# Patient Record
Sex: Female | Born: 1965 | Race: White | Hispanic: No | Marital: Married | Smoking: Never smoker
Health system: Southern US, Community
[De-identification: ages and names within clinical notes are randomized; demographics above are authoritative.]

## PROBLEM LIST (undated history)

## (undated) DIAGNOSIS — R519 Headache, unspecified: Secondary | ICD-10-CM

## (undated) DIAGNOSIS — C801 Malignant (primary) neoplasm, unspecified: Secondary | ICD-10-CM

## (undated) DIAGNOSIS — K219 Gastro-esophageal reflux disease without esophagitis: Secondary | ICD-10-CM

## (undated) DIAGNOSIS — J189 Pneumonia, unspecified organism: Secondary | ICD-10-CM

## (undated) DIAGNOSIS — R609 Edema, unspecified: Secondary | ICD-10-CM

## (undated) DIAGNOSIS — G4730 Sleep apnea, unspecified: Secondary | ICD-10-CM

## (undated) DIAGNOSIS — T8859XA Other complications of anesthesia, initial encounter: Secondary | ICD-10-CM

## (undated) DIAGNOSIS — T7840XA Allergy, unspecified, initial encounter: Secondary | ICD-10-CM

## (undated) DIAGNOSIS — Z9889 Other specified postprocedural states: Secondary | ICD-10-CM

## (undated) DIAGNOSIS — M549 Dorsalgia, unspecified: Secondary | ICD-10-CM

## (undated) DIAGNOSIS — Z8719 Personal history of other diseases of the digestive system: Secondary | ICD-10-CM

## (undated) DIAGNOSIS — J40 Bronchitis, not specified as acute or chronic: Secondary | ICD-10-CM

## (undated) DIAGNOSIS — J329 Chronic sinusitis, unspecified: Secondary | ICD-10-CM

## (undated) DIAGNOSIS — J45909 Unspecified asthma, uncomplicated: Secondary | ICD-10-CM

## (undated) DIAGNOSIS — T4145XA Adverse effect of unspecified anesthetic, initial encounter: Secondary | ICD-10-CM

## (undated) DIAGNOSIS — R51 Headache: Secondary | ICD-10-CM

## (undated) DIAGNOSIS — R112 Nausea with vomiting, unspecified: Secondary | ICD-10-CM

## (undated) HISTORY — PX: TUBAL LIGATION: SHX77

## (undated) HISTORY — DX: Gastro-esophageal reflux disease without esophagitis: K21.9

## (undated) HISTORY — PX: KNEE SURGERY: SHX244

## (undated) HISTORY — PX: CHOLECYSTECTOMY: SHX55

## (undated) HISTORY — PX: ABDOMINAL HYSTERECTOMY: SHX81

## (undated) HISTORY — DX: Unspecified asthma, uncomplicated: J45.909

## (undated) HISTORY — DX: Sleep apnea, unspecified: G47.30

---

## 2008-10-14 ENCOUNTER — Emergency Department (HOSPITAL_COMMUNITY): Admission: EM | Admit: 2008-10-14 | Discharge: 2008-10-14 | Payer: Self-pay | Admitting: Emergency Medicine

## 2009-01-28 DIAGNOSIS — G43109 Migraine with aura, not intractable, without status migrainosus: Secondary | ICD-10-CM | POA: Insufficient documentation

## 2011-07-14 ENCOUNTER — Ambulatory Visit: Payer: Self-pay | Attending: Orthopedic Surgery | Admitting: Physical Therapy

## 2011-07-14 DIAGNOSIS — IMO0001 Reserved for inherently not codable concepts without codable children: Secondary | ICD-10-CM | POA: Insufficient documentation

## 2011-07-14 DIAGNOSIS — M25569 Pain in unspecified knee: Secondary | ICD-10-CM | POA: Insufficient documentation

## 2011-07-14 DIAGNOSIS — M25669 Stiffness of unspecified knee, not elsewhere classified: Secondary | ICD-10-CM | POA: Insufficient documentation

## 2011-07-15 ENCOUNTER — Ambulatory Visit: Payer: Self-pay | Admitting: Physical Therapy

## 2011-07-22 ENCOUNTER — Ambulatory Visit: Payer: Self-pay | Admitting: Physical Therapy

## 2011-07-29 ENCOUNTER — Ambulatory Visit: Payer: Self-pay | Attending: Orthopedic Surgery | Admitting: Physical Therapy

## 2011-07-29 DIAGNOSIS — IMO0001 Reserved for inherently not codable concepts without codable children: Secondary | ICD-10-CM | POA: Insufficient documentation

## 2011-07-29 DIAGNOSIS — M25669 Stiffness of unspecified knee, not elsewhere classified: Secondary | ICD-10-CM | POA: Insufficient documentation

## 2011-07-29 DIAGNOSIS — M25569 Pain in unspecified knee: Secondary | ICD-10-CM | POA: Insufficient documentation

## 2011-08-02 ENCOUNTER — Ambulatory Visit: Payer: Self-pay | Admitting: Physical Therapy

## 2011-08-04 ENCOUNTER — Ambulatory Visit: Payer: Self-pay | Admitting: Physical Therapy

## 2011-08-09 ENCOUNTER — Ambulatory Visit: Payer: Self-pay | Admitting: Physical Therapy

## 2011-08-11 ENCOUNTER — Ambulatory Visit: Payer: Self-pay | Admitting: Physical Therapy

## 2011-08-12 ENCOUNTER — Ambulatory Visit: Payer: Self-pay | Admitting: Physical Therapy

## 2011-08-23 ENCOUNTER — Ambulatory Visit: Payer: Self-pay | Admitting: Physical Therapy

## 2011-08-25 ENCOUNTER — Ambulatory Visit: Payer: Self-pay | Admitting: Physical Therapy

## 2011-08-30 ENCOUNTER — Encounter: Payer: Self-pay | Admitting: Physical Therapy

## 2012-08-31 ENCOUNTER — Ambulatory Visit
Admission: RE | Admit: 2012-08-31 | Discharge: 2012-08-31 | Disposition: A | Discharge status: Home / Self Care | Payer: PRIVATE HEALTH INSURANCE | Source: Ambulatory Visit | Attending: Internal Medicine | Admitting: Internal Medicine

## 2012-08-31 ENCOUNTER — Other Ambulatory Visit: Payer: Self-pay | Admitting: Internal Medicine

## 2012-08-31 DIAGNOSIS — R609 Edema, unspecified: Secondary | ICD-10-CM

## 2012-09-06 ENCOUNTER — Other Ambulatory Visit: Payer: Self-pay | Admitting: Internal Medicine

## 2012-09-06 DIAGNOSIS — G459 Transient cerebral ischemic attack, unspecified: Secondary | ICD-10-CM

## 2012-09-11 ENCOUNTER — Ambulatory Visit
Admission: RE | Admit: 2012-09-11 | Discharge: 2012-09-11 | Disposition: A | Discharge status: Home / Self Care | Payer: PRIVATE HEALTH INSURANCE | Source: Ambulatory Visit | Attending: Internal Medicine | Admitting: Internal Medicine

## 2012-09-11 DIAGNOSIS — G459 Transient cerebral ischemic attack, unspecified: Secondary | ICD-10-CM

## 2014-08-28 ENCOUNTER — Other Ambulatory Visit (INDEPENDENT_AMBULATORY_CARE_PROVIDER_SITE_OTHER): Payer: Self-pay

## 2014-08-28 ENCOUNTER — Other Ambulatory Visit: Payer: Self-pay | Admitting: General Surgery

## 2014-08-28 DIAGNOSIS — K219 Gastro-esophageal reflux disease without esophagitis: Secondary | ICD-10-CM

## 2014-08-28 DIAGNOSIS — G4733 Obstructive sleep apnea (adult) (pediatric): Secondary | ICD-10-CM

## 2014-08-28 DIAGNOSIS — Z01818 Encounter for other preprocedural examination: Secondary | ICD-10-CM

## 2014-09-09 ENCOUNTER — Other Ambulatory Visit (HOSPITAL_COMMUNITY): Payer: PRIVATE HEALTH INSURANCE

## 2014-09-11 ENCOUNTER — Encounter: Payer: Self-pay | Admitting: Dietician

## 2014-09-11 ENCOUNTER — Encounter: Payer: 59 | Attending: General Surgery | Admitting: Dietician

## 2014-09-11 DIAGNOSIS — Z713 Dietary counseling and surveillance: Secondary | ICD-10-CM | POA: Diagnosis not present

## 2014-09-11 DIAGNOSIS — Z6841 Body Mass Index (BMI) 40.0 and over, adult: Secondary | ICD-10-CM | POA: Diagnosis not present

## 2014-09-11 NOTE — Progress Notes (Signed)
  Pre-Op Assessment Visit:  Pre-Operative RYGB Surgery  Medical Nutrition Therapy:  Appt start time: 8032   End time:  1200.  Patient was seen on 09/11/2014 for Pre-Operative Nutrition Assessment. Assessment and letter of approval faxed to [facility] Surgery Bariatric Surgery Program coordinator on 09/11/2014.   Preferred Learning Style:   No preference indicated   Learning Readiness:   Ready  Handouts given during visit include:  Pre-Op Goals Bariatric Surgery Protein Shakes   During the appointment today the following Pre-Op Goals were reviewed with the patient: Maintain or lose weight as instructed by your surgeon Make healthy food choices Begin to limit portion sizes Limited concentrated sugars and fried foods Keep fat/sugar in the single digits per serving on   food labels Practice CHEWING your food  (aim for 30 chews per bite or until applesauce consistency) Practice not drinking 15 minutes before, during, and 30 minutes after each meal/snack Avoid all carbonated beverages  Avoid/limit caffeinated beverages  Avoid all sugar-sweetened beverages Consume 3 meals per day; eat every 3-5 hours Make a list of non-food related activities Aim for 64-100 ounces of FLUID daily  Aim for at least 60-80 grams of PROTEIN daily Look for a liquid protein source that contain ?15 g protein and ?5 g carbohydrate  (ex: shakes, drinks, shots)  Patient-Centered Goals: Kristen Delacruz would like to sleep better, not feel winded, feel better overall, and have less pain. Kristen Delacruz is very confident that she is able to follow the pre op goals and feels that they are very important.   Demonstrated degree of understanding via:  Teach Back  Teaching Method Utilized:  Visual Auditory Hands on  Barriers to learning/adherence to lifestyle change: none  Patient to call the [facility] to enroll in Pre-Op and Post-Op Nutrition Education when surgery date is scheduled.

## 2014-09-11 NOTE — Patient Instructions (Signed)

## 2014-09-12 ENCOUNTER — Ambulatory Visit (HOSPITAL_COMMUNITY)
Admission: RE | Admit: 2014-09-12 | Discharge: 2014-09-12 | Disposition: A | Discharge status: Home / Self Care | Payer: 59 | Source: Ambulatory Visit | Attending: General Surgery | Admitting: General Surgery

## 2014-09-12 ENCOUNTER — Other Ambulatory Visit: Payer: Self-pay

## 2014-09-12 DIAGNOSIS — G4730 Sleep apnea, unspecified: Secondary | ICD-10-CM | POA: Insufficient documentation

## 2014-09-12 DIAGNOSIS — K219 Gastro-esophageal reflux disease without esophagitis: Secondary | ICD-10-CM | POA: Diagnosis not present

## 2014-09-12 DIAGNOSIS — J45909 Unspecified asthma, uncomplicated: Secondary | ICD-10-CM | POA: Insufficient documentation

## 2014-09-12 DIAGNOSIS — Z6841 Body Mass Index (BMI) 40.0 and over, adult: Secondary | ICD-10-CM | POA: Diagnosis not present

## 2014-09-12 DIAGNOSIS — Z13820 Encounter for screening for osteoporosis: Secondary | ICD-10-CM | POA: Insufficient documentation

## 2014-09-23 ENCOUNTER — Ambulatory Visit (HOSPITAL_COMMUNITY)
Admission: RE | Admit: 2014-09-23 | Discharge: 2014-09-23 | Disposition: A | Discharge status: Home / Self Care | Payer: 59 | Source: Ambulatory Visit | Attending: General Surgery | Admitting: General Surgery

## 2014-09-23 ENCOUNTER — Encounter (HOSPITAL_COMMUNITY)
Admission: RE | Disposition: A | Discharge status: Home / Self Care | Payer: Self-pay | Source: Ambulatory Visit | Attending: General Surgery

## 2014-09-23 HISTORY — PX: BREATH TEK H PYLORI: SHX5422

## 2014-09-23 SURGERY — BREATH TEST, FOR HELICOBACTER PYLORI

## 2014-09-23 NOTE — Progress Notes (Signed)
   09/23/14 [place]  Referring MD Excell Seltzer  Time of Last PO Intake 2330  The Patient Had The Following Meds In The Last Two Weeks Proton Pump Inhibitors  Baseline Breath At: 0824  Pranactin Given At: 0826  Post-Dose Breath At: 0845  Sample 1 2.2%  Sample 2 2.5%  Test Negative

## 2014-09-24 ENCOUNTER — Encounter (HOSPITAL_COMMUNITY): Payer: Self-pay | Admitting: General Surgery

## 2014-10-10 ENCOUNTER — Encounter: Payer: 59 | Attending: General Surgery | Admitting: Dietician

## 2014-10-10 DIAGNOSIS — Z713 Dietary counseling and surveillance: Secondary | ICD-10-CM | POA: Insufficient documentation

## 2014-10-10 DIAGNOSIS — Z6841 Body Mass Index (BMI) 40.0 and over, adult: Secondary | ICD-10-CM | POA: Diagnosis not present

## 2014-10-10 NOTE — Patient Instructions (Addendum)
Check out [email or web address].com for protein shakes. Continue chewing well. Keep working on not drinking during meals (don't drink 15 minutes before and wait 30 minutes after eating to drink). Plan to use weights and walk for 30-45 minutes for 3-4 x week. Call [facility] at [phone] when surgery is scheduled to enroll in Pre-Op Class

## 2014-10-10 NOTE — Progress Notes (Signed)
  1 Months Supervised Weight Loss Visit:   Pre-Operative RYGB Surgery  Medical Nutrition Therapy:  Appt start time: 1100 end time:  1115.  Primary concerns today: Supervised Weight Loss Visit. Returns with a 5 lbs weight loss. Has been working on chewing food well, drinking G2 and water, and trying to cut back on carbs. Has been taking MVI and B12. Having some swelling in her hands and feet. Has not joined the gym yet since she has been really busy.  Wt Readings from Last 3 Encounters:  10/10/14 261 lb 4.8 oz (118.525 kg)  09/11/14 255 lb 14.4 oz (116.075 kg)   Ht Readings from Last 3 Encounters:  10/10/14 5\' 5"  (1.651 m)  09/11/14 5\' 5"  (1.651 m)   Body mass index is 43.48 kg/(m^2). @BMIFA @ Normalized weight-for-age data available only for age 58 to 28 years. Normalized stature-for-age data available only for age 58 to 36 years.   Patient-Centered Goals: Aastha would like to sleep better, not feel winded, feel better overall, and have less pain. Orra is very confident that she is able to follow the pre op goals and feels that they are very important.   Preferred Learning Style:   No preference indicated   Learning Readiness:   Ready   Medications: see list  Recent physical activity:  none  Progress Towards Goal(s):  In progress.    Nutritional Diagnosis:  [place]-3.3 Obesity related to past poor dietary habits and physical inactivity as evidenced by patient attending supervised weight loss for insurance approval of bariatric surgery.    Intervention:  Nutrition counseling provided. Plan: Check out [email or web address].com for protein shakes. Continue chewing well. Keep working on not drinking during meals (don't drink 15 minutes before and wait 30 minutes after eating to drink). Plan to use weights and walk for 30-45 minutes for 3-4 x week. Call [facility] at [phone] when surgery is scheduled to enroll in Pre-Op Class  Teaching Method Utilized:  Visual Auditory Hands  on  Barriers to learning/adherence to lifestyle change: none  Demonstrated degree of understanding via:  Teach Back   Monitoring/Evaluation:  Dietary intake, exercise, and body weight. Follow up to attend Pre-Op Class

## 2014-10-24 ENCOUNTER — Other Ambulatory Visit: Payer: Self-pay | Admitting: General Surgery

## 2014-10-28 ENCOUNTER — Encounter: Payer: 59 | Attending: General Surgery

## 2014-10-28 DIAGNOSIS — Z713 Dietary counseling and surveillance: Secondary | ICD-10-CM | POA: Insufficient documentation

## 2014-10-28 DIAGNOSIS — Z6841 Body Mass Index (BMI) 40.0 and over, adult: Secondary | ICD-10-CM | POA: Diagnosis not present

## 2014-10-28 NOTE — Progress Notes (Signed)
  Pre-Operative Nutrition Class:  Appt start time: 830   End time:  930.  Patient was seen on 10-28-14 for Pre-Operative Bariatric Surgery Education at the [facility].   Surgery date: 11/25/14 Surgery type: RYGB [facility] weight at [facility]: 256 lbs on 09/11/14 Weight today: 259 lbs  TANITA  BODY COMP RESULTS  10/28/14   BMI (kg/m^2) 41.8   Fat Mass (lbs) 137.5   Fat Free Mass (lbs) 121.5   Total Body Water (lbs) 89    Samples given per MNT protocol. Patient educated on appropriate usage: Premier protein shake (qty 1 - vanilla) Lot #: [id number] Exp: 06/2015  Unjury protein powder (unflavored - qty 1) Lot #: [id number] Exp: 08/2015  Bariatric Advantage Calcium citrate chew (orange - qty 1) Lot #: [id number]  Exp: 11/2014  The following the learning objectives were met by the patient during this course:  Identify Pre-Op Dietary Goals and will begin 2 weeks pre-operatively  Identify appropriate sources of fluids and proteins   State protein recommendations and appropriate sources pre and post-operatively  Identify Post-Operative Dietary Goals and will follow for 2 weeks post-operatively  Identify appropriate multivitamin and calcium sources  Describe the need for physical activity post-operatively and will follow MD recommendations  State when to call healthcare provider regarding medication questions or post-operative complications  Handouts given during class include:  Pre-Op Bariatric Surgery Diet Handout  Protein Shake Handout  Post-Op Bariatric Surgery Nutrition Handout  BELT Program Information Flyer  Support Group Information Flyer  [facility] Bariatric Supplements Price List  Follow-Up Plan: Patient will follow-up at [facility] 2 weeks post operatively for diet advancement per MD.

## 2014-11-15 ENCOUNTER — Encounter (HOSPITAL_COMMUNITY): Payer: Self-pay

## 2014-11-15 ENCOUNTER — Encounter (HOSPITAL_COMMUNITY)
Admission: RE | Admit: 2014-11-15 | Discharge: 2014-11-15 | Disposition: A | Discharge status: Home / Self Care | Payer: 59 | Source: Ambulatory Visit | Attending: General Surgery | Admitting: General Surgery

## 2014-11-15 DIAGNOSIS — Z01818 Encounter for other preprocedural examination: Secondary | ICD-10-CM | POA: Diagnosis present

## 2014-11-15 HISTORY — DX: Edema, unspecified: R60.9

## 2014-11-15 HISTORY — DX: Other complications of anesthesia, initial encounter: T88.59XA

## 2014-11-15 HISTORY — DX: Pneumonia, unspecified organism: J18.9

## 2014-11-15 HISTORY — DX: Other specified postprocedural states: R11.2

## 2014-11-15 HISTORY — DX: Chronic sinusitis, unspecified: J32.9

## 2014-11-15 HISTORY — DX: Bronchitis, not specified as acute or chronic: J40

## 2014-11-15 HISTORY — DX: Other specified postprocedural states: Z98.890

## 2014-11-15 HISTORY — DX: Dorsalgia, unspecified: M54.9

## 2014-11-15 HISTORY — DX: Personal history of other diseases of the digestive system: Z87.19

## 2014-11-15 HISTORY — DX: Allergy, unspecified, initial encounter: T78.40XA

## 2014-11-15 HISTORY — DX: Adverse effect of unspecified anesthetic, initial encounter: T41.45XA

## 2014-11-15 LAB — COMPREHENSIVE METABOLIC PANEL
ALK PHOS: 83 U/L (ref 39–117)
ALT: 34 U/L (ref 0–35)
ANION GAP: 7 (ref 5–15)
AST: 33 U/L (ref 0–37)
Albumin: 3.8 g/dL (ref 3.5–5.2)
BUN: 28 mg/dL — AB (ref 6–23)
CHLORIDE: 99 mmol/L (ref 96–112)
CO2: 29 mmol/L (ref 19–32)
Calcium: 8.9 mg/dL (ref 8.4–10.5)
Creatinine, Ser: 0.73 mg/dL (ref 0.50–1.10)
GFR calc Af Amer: >90 mL/min (ref >90)
GFR calc non Af Amer: >90 mL/min (ref >90)
Glucose, Bld: 94 mg/dL (ref 70–99)
POTASSIUM: 3.6 mmol/L (ref 3.5–5.1)
SODIUM: 135 mmol/L (ref 135–145)
TOTAL PROTEIN: 7.7 g/dL (ref 6.0–8.3)
Total Bilirubin: 0.4 mg/dL (ref 0.3–1.2)

## 2014-11-15 LAB — CBC WITH DIFFERENTIAL/PLATELET
BASOS ABS: 0 10*3/uL (ref 0.0–0.1)
BASOS PCT: 0 % (ref 0–1)
Eosinophils Absolute: 0.2 10*3/uL (ref 0.0–0.7)
Eosinophils Relative: 2 % (ref 0–5)
HEMATOCRIT: 44.3 % (ref 36.0–46.0)
HEMOGLOBIN: 14.7 g/dL (ref 12.0–15.0)
LYMPHS PCT: 24 % (ref 12–46)
Lymphs Abs: 2.2 10*3/uL (ref 0.7–4.0)
MCH: 30.3 pg (ref 26.0–34.0)
MCHC: 33.2 g/dL (ref 30.0–36.0)
MCV: 91.3 fL (ref 78.0–100.0)
MONO ABS: 0.5 10*3/uL (ref 0.1–1.0)
MONOS PCT: 6 % (ref 3–12)
NEUTROS ABS: 6.2 10*3/uL (ref 1.7–7.7)
NEUTROS PCT: 68 % (ref 43–77)
Platelets: 255 10*3/uL (ref 150–400)
RBC: 4.85 MIL/uL (ref 3.87–5.11)
RDW: 13.2 % (ref 11.5–15.5)
WBC: 9.1 10*3/uL (ref 4.0–10.5)

## 2014-11-15 NOTE — Progress Notes (Addendum)
CXR per epic 09/12/2014 EKG per epic 09/12/2014  OV note per Dr Beatrix Fetters per chart 11/02/2010 Stress ECHO 09/25/2010 per chart noting EKG pretest 09/25/2010  US Carotid Duplex bilateral per epic 09/11/2012

## 2014-11-15 NOTE — Patient Instructions (Signed)
[place]  11/15/2014   Your procedure is scheduled on:      Monday Nov 25, 2014  Report to [facility] Main Entrance and follow signs to  [place] arrive at [place] AM.  Call this number if you have problems the morning of surgery [phone] or Presurgical Testing [phone].   Remember:  Do not eat food or drink liquids :After Midnight.  For Living Will and/or Health Care Power Attorney Forms: please provide copy for your medical record, may bring AM of surgery (forms should be already notarized-we do not provide this service).  Remember: follow any bowel prep instructions per MD office.   Take these medicines the morning of surgery with A SIP OF WATER: Omeprazole (Prilosec); Eye Drops if needed (bring day of surgery); use albuterol inhaler if needed (bring day of surgery)                               You may not have any metal on your body including hair pins and piercings  Do not wear jewelry, make-up, lotions, powders, perfumes, nail polish or deodorant.  Do not shave body hair  48 hours(2 days) of CHG soap use.                Do not bring valuables to the hospital. [place].  Contacts, dentures or bridgework may not be worn into surgery.  Leave suitcase in the car. After surgery it may be brought to your room.  For patients admitted to the hospital, checkout time is 11:00 AM the day of discharge.   ________________________________________________________________________  [facility] - Preparing for Surgery Before surgery, you can play an important role.  Because skin is not sterile, your skin needs to be as free of germs as possible.  You can reduce the number of germs on your skin by washing with CHG (chlorahexidine gluconate) soap before surgery.  CHG is an antiseptic cleaner which kills germs and bonds with the skin to continue killing germs even after washing. Please DO NOT use if you have an allergy to CHG or  antibacterial soaps.  If your skin becomes reddened/irritated stop using the CHG and inform your nurse when you arrive at Short Stay. Do not shave (including legs and underarms) for at least 48 hours prior to the first CHG shower.  You may shave your face/neck. Please follow these instructions carefully:  1.  Shower with CHG Soap the night before surgery and the  morning of Surgery.  2.  If you choose to wash your hair, wash your hair first as usual with your  normal  shampoo.  3.  After you shampoo, rinse your hair and body thoroughly to remove the  shampoo.                           4.  Use CHG as you would any other liquid soap.  You can apply chg directly  to the skin and wash                       Gently with a scrungie or clean washcloth.  5.  Apply the CHG Soap to your body ONLY FROM THE NECK DOWN.   Do not use on face/ open  Wound or open sores. Avoid contact with eyes, ears mouth and genitals (private parts).                       Wash face,  Genitals (private parts) with your normal soap.             6.  Wash thoroughly, paying special attention to the area where your surgery  will be performed.  7.  Thoroughly rinse your body with warm water from the neck down.  8.  DO NOT shower/wash with your normal soap after using and rinsing off  the CHG Soap.                9.  Pat yourself dry with a clean towel.            10.  Wear clean pajamas.            11.  Place clean sheets on your bed the night of your first shower and do not  sleep with pets. Day of Surgery : Do not apply any lotions/deodorants the morning of surgery.  Please wear clean clothes to the hospital/surgery center.  FAILURE TO FOLLOW THESE INSTRUCTIONS MAY RESULT IN THE CANCELLATION OF YOUR SURGERY PATIENT SIGNATURE_________________________________  NURSE SIGNATURE__________________________________  ________________________________________________________________________

## 2014-11-15 NOTE — Progress Notes (Signed)
CMP results in epic per PAT visit 11/15/2014 sent to Dr Excell Seltzer

## 2014-11-25 ENCOUNTER — Inpatient Hospital Stay (HOSPITAL_COMMUNITY): Payer: 59 | Admitting: Certified Registered"

## 2014-11-25 ENCOUNTER — Encounter (HOSPITAL_COMMUNITY): Payer: Self-pay | Admitting: *Deleted

## 2014-11-25 ENCOUNTER — Inpatient Hospital Stay (HOSPITAL_COMMUNITY)
Admission: RE | Admit: 2014-11-25 | Discharge: 2014-11-29 | DRG: 621 | Disposition: A | Discharge status: Home / Self Care | Payer: 59 | Source: Ambulatory Visit | Attending: General Surgery | Admitting: General Surgery

## 2014-11-25 ENCOUNTER — Encounter (HOSPITAL_COMMUNITY)
Admission: RE | Disposition: A | Discharge status: Home / Self Care | Payer: Self-pay | Source: Ambulatory Visit | Attending: General Surgery

## 2014-11-25 DIAGNOSIS — G4730 Sleep apnea, unspecified: Secondary | ICD-10-CM | POA: Diagnosis present

## 2014-11-25 DIAGNOSIS — Z6841 Body Mass Index (BMI) 40.0 and over, adult: Secondary | ICD-10-CM

## 2014-11-25 DIAGNOSIS — K219 Gastro-esophageal reflux disease without esophagitis: Secondary | ICD-10-CM | POA: Diagnosis present

## 2014-11-25 DIAGNOSIS — R112 Nausea with vomiting, unspecified: Secondary | ICD-10-CM | POA: Diagnosis not present

## 2014-11-25 DIAGNOSIS — G43909 Migraine, unspecified, not intractable, without status migrainosus: Secondary | ICD-10-CM | POA: Diagnosis present

## 2014-11-25 DIAGNOSIS — G8929 Other chronic pain: Secondary | ICD-10-CM | POA: Diagnosis present

## 2014-11-25 DIAGNOSIS — Z9884 Bariatric surgery status: Secondary | ICD-10-CM

## 2014-11-25 DIAGNOSIS — J45909 Unspecified asthma, uncomplicated: Secondary | ICD-10-CM | POA: Diagnosis present

## 2014-11-25 DIAGNOSIS — Z90710 Acquired absence of both cervix and uterus: Secondary | ICD-10-CM | POA: Diagnosis not present

## 2014-11-25 HISTORY — PX: GASTRIC ROUX-EN-Y: SHX5262

## 2014-11-25 LAB — HEMOGLOBIN AND HEMATOCRIT, BLOOD
HCT: 39.3 % (ref 36.0–46.0)
HEMOGLOBIN: 13.2 g/dL (ref 12.0–15.0)

## 2014-11-25 SURGERY — LAPAROSCOPIC ROUX-EN-Y GASTRIC
Anesthesia: General | Site: Abdomen

## 2014-11-25 MED ORDER — UNJURY VANILLA POWDER: 2.0000 [oz_av] | Freq: Four times a day (QID) | ORAL | Status: DC

## 2014-11-25 MED ORDER — BUPIVACAINE-EPINEPHRINE 0.25% -1:200000 IJ SOLN
INTRAMUSCULAR | Status: AC
  Filled 2014-11-25: qty 1

## 2014-11-25 MED ORDER — NEOSTIGMINE METHYLSULFATE 10 MG/10ML IV SOLN
INTRAVENOUS | Status: AC
Start: 2014-11-25 — End: 2014-11-25
  Filled 2014-11-25: qty 1

## 2014-11-25 MED ORDER — ONDANSETRON HCL 4 MG/2ML IJ SOLN
INTRAMUSCULAR | Status: DC | PRN
  Administered 2014-11-25: 4 mg via INTRAVENOUS

## 2014-11-25 MED ORDER — ONDANSETRON HCL 4 MG/2ML IJ SOLN
4.0000 mg | INTRAMUSCULAR | Status: DC | PRN
  Administered 2014-11-25 – 2014-11-28 (×17): 4 mg via INTRAVENOUS
  Filled 2014-11-25 (×17): qty 2

## 2014-11-25 MED ORDER — PROPOFOL 10 MG/ML IV BOLUS
INTRAVENOUS | Status: AC
  Filled 2014-11-25: qty 20

## 2014-11-25 MED ORDER — HEPARIN SODIUM (PORCINE) 5000 UNIT/ML IJ SOLN
5000.0000 [IU] | INTRAMUSCULAR | Status: AC
  Administered 2014-11-25: 5000 [IU] via SUBCUTANEOUS
  Filled 2014-11-25: qty 1

## 2014-11-25 MED ORDER — OXYCODONE HCL 5 MG/5ML PO SOLN
5.0000 mg | ORAL | Status: DC | PRN
  Administered 2014-11-26 (×2): 10 mg via ORAL
  Administered 2014-11-28 – 2014-11-29 (×3): 5 mg via ORAL
  Filled 2014-11-25: qty 5
  Filled 2014-11-25: qty 25
  Filled 2014-11-25: qty 5
  Filled 2014-11-25 (×2): qty 10

## 2014-11-25 MED ORDER — MORPHINE SULFATE 2 MG/ML IJ SOLN
2.0000 mg | INTRAMUSCULAR | Status: DC | PRN
  Administered 2014-11-25 (×3): 2 mg via INTRAVENOUS
  Administered 2014-11-25 (×2): 4 mg via INTRAVENOUS
  Administered 2014-11-25 – 2014-11-26 (×6): 2 mg via INTRAVENOUS
  Filled 2014-11-25: qty 1
  Filled 2014-11-25: qty 2
  Filled 2014-11-25 (×8): qty 1
  Filled 2014-11-25: qty 2

## 2014-11-25 MED ORDER — TISSEEL VH 10 ML EX KIT
PACK | CUTANEOUS | Status: AC
  Filled 2014-11-25: qty 1

## 2014-11-25 MED ORDER — DEXAMETHASONE SODIUM PHOSPHATE 10 MG/ML IJ SOLN
INTRAMUSCULAR | Status: DC | PRN
  Administered 2014-11-25: 10 mg via INTRAVENOUS

## 2014-11-25 MED ORDER — GLYCOPYRROLATE 0.2 MG/ML IJ SOLN
INTRAMUSCULAR | Status: AC
  Filled 2014-11-25: qty 3

## 2014-11-25 MED ORDER — ROCURONIUM BROMIDE 100 MG/10ML IV SOLN
INTRAVENOUS | Status: AC
  Filled 2014-11-25: qty 1

## 2014-11-25 MED ORDER — SCOPOLAMINE 1 MG/3DAYS TD PT72
MEDICATED_PATCH | TRANSDERMAL | Status: AC
  Filled 2014-11-25: qty 1

## 2014-11-25 MED ORDER — LIDOCAINE HCL (CARDIAC) 20 MG/ML IV SOLN
INTRAVENOUS | Status: DC | PRN
  Administered 2014-11-25: 100 mg via INTRAVENOUS

## 2014-11-25 MED ORDER — ACETAMINOPHEN 160 MG/5ML PO SOLN
650.0000 mg | ORAL | Status: DC | PRN
  Administered 2014-11-28 – 2014-11-29 (×2): 650 mg via ORAL
  Filled 2014-11-25 (×2): qty 20.3

## 2014-11-25 MED ORDER — ROCURONIUM BROMIDE 100 MG/10ML IV SOLN
INTRAVENOUS | Status: DC | PRN
  Administered 2014-11-25: 10 mg via INTRAVENOUS
  Administered 2014-11-25: 50 mg via INTRAVENOUS
  Administered 2014-11-25: 10 mg via INTRAVENOUS
  Administered 2014-11-25: 2 mg via INTRAVENOUS
  Administered 2014-11-25: 20 mg via INTRAVENOUS

## 2014-11-25 MED ORDER — METOCLOPRAMIDE HCL 5 MG/ML IJ SOLN
INTRAMUSCULAR | Status: AC
  Filled 2014-11-25: qty 2

## 2014-11-25 MED ORDER — UNJURY CHICKEN SOUP POWDER
2.0000 [oz_av] | Freq: Four times a day (QID) | ORAL | Status: DC
  Administered 2014-11-27 (×3): 2 [oz_av] via ORAL

## 2014-11-25 MED ORDER — ONDANSETRON HCL 4 MG/2ML IJ SOLN
INTRAMUSCULAR | Status: AC
  Filled 2014-11-25: qty 2

## 2014-11-25 MED ORDER — LABETALOL HCL 5 MG/ML IV SOLN
INTRAVENOUS | Status: DC | PRN
  Administered 2014-11-25: 5 mg via INTRAVENOUS

## 2014-11-25 MED ORDER — CEFOXITIN SODIUM 2 G IV SOLR
2.0000 g | INTRAVENOUS | Status: AC
Start: 2014-11-25 — End: 2014-11-25
  Administered 2014-11-25 (×2): 2 g via INTRAVENOUS

## 2014-11-25 MED ORDER — MIDAZOLAM HCL 2 MG/2ML IJ SOLN
INTRAMUSCULAR | Status: AC
  Filled 2014-11-25: qty 2

## 2014-11-25 MED ORDER — NEOSTIGMINE METHYLSULFATE 10 MG/10ML IV SOLN
INTRAVENOUS | Status: DC | PRN
Start: 2014-11-25 — End: 2014-11-25
  Administered 2014-11-25: 5 mg via INTRAVENOUS

## 2014-11-25 MED ORDER — METOCLOPRAMIDE HCL 5 MG/ML IJ SOLN
INTRAMUSCULAR | Status: DC | PRN
  Administered 2014-11-25: 10 mg via INTRAVENOUS

## 2014-11-25 MED ORDER — HEPARIN SODIUM (PORCINE) 5000 UNIT/ML IJ SOLN
5000.0000 [IU] | Freq: Three times a day (TID) | INTRAMUSCULAR | Status: DC
  Administered 2014-11-25 – 2014-11-29 (×11): 5000 [IU] via SUBCUTANEOUS
  Filled 2014-11-25 (×14): qty 1

## 2014-11-25 MED ORDER — MIDAZOLAM HCL 2 MG/2ML IJ SOLN
INTRAMUSCULAR | Status: DC | PRN
  Administered 2014-11-25: 2 mg via INTRAVENOUS

## 2014-11-25 MED ORDER — SCOPOLAMINE 1 MG/3DAYS TD PT72
MEDICATED_PATCH | TRANSDERMAL | Status: DC | PRN
  Administered 2014-11-25: 1 via TRANSDERMAL

## 2014-11-25 MED ORDER — ALBUTEROL SULFATE (2.5 MG/3ML) 0.083% IN NEBU: 3.0000 mL | INHALATION_SOLUTION | RESPIRATORY_TRACT | Status: DC | PRN

## 2014-11-25 MED ORDER — UNJURY CHOCOLATE CLASSIC POWDER
2.0000 [oz_av] | Freq: Four times a day (QID) | ORAL | Status: DC
  Administered 2014-11-27 – 2014-11-29 (×5): 2 [oz_av] via ORAL

## 2014-11-25 MED ORDER — FENTANYL CITRATE (PF) 250 MCG/5ML IJ SOLN
INTRAMUSCULAR | Status: AC
  Filled 2014-11-25: qty 5

## 2014-11-25 MED ORDER — LACTATED RINGERS IR SOLN
Status: DC | PRN
  Administered 2014-11-25: 1000 mL

## 2014-11-25 MED ORDER — PROPOFOL 10 MG/ML IV BOLUS
INTRAVENOUS | Status: DC | PRN
  Administered 2014-11-25: 200 mg via INTRAVENOUS

## 2014-11-25 MED ORDER — ACETAMINOPHEN 160 MG/5ML PO SOLN: 325.0000 mg | ORAL | Status: DC | PRN

## 2014-11-25 MED ORDER — DEXTROSE 5 % IV SOLN
INTRAVENOUS | Status: AC
  Filled 2014-11-25: qty 2

## 2014-11-25 MED ORDER — DEXAMETHASONE SODIUM PHOSPHATE 10 MG/ML IJ SOLN
INTRAMUSCULAR | Status: AC
  Filled 2014-11-25: qty 1

## 2014-11-25 MED ORDER — TISSEEL VH 10 ML EX KIT
PACK | CUTANEOUS | Status: DC | PRN
  Administered 2014-11-25: 10 mL

## 2014-11-25 MED ORDER — HYDROMORPHONE HCL 1 MG/ML IJ SOLN
0.2500 mg | INTRAMUSCULAR | Status: DC | PRN
  Administered 2014-11-25 (×3): 0.5 mg via INTRAVENOUS

## 2014-11-25 MED ORDER — GLYCOPYRROLATE 0.2 MG/ML IJ SOLN
INTRAMUSCULAR | Status: DC | PRN
  Administered 2014-11-25: 0.6 mg via INTRAVENOUS

## 2014-11-25 MED ORDER — FENTANYL CITRATE (PF) 250 MCG/5ML IJ SOLN
INTRAMUSCULAR | Status: DC | PRN
  Administered 2014-11-25 (×5): 50 ug via INTRAVENOUS

## 2014-11-25 MED ORDER — CHLORHEXIDINE GLUCONATE CLOTH 2 % EX PADS: 6.0000 | MEDICATED_PAD | Freq: Once | CUTANEOUS | Status: DC

## 2014-11-25 MED ORDER — PROMETHAZINE HCL 25 MG/ML IJ SOLN
12.5000 mg | INTRAMUSCULAR | Status: DC | PRN
  Administered 2014-11-25 – 2014-11-27 (×8): 12.5 mg via INTRAVENOUS
  Filled 2014-11-25 (×8): qty 1

## 2014-11-25 MED ORDER — KCL IN DEXTROSE-NACL 20-5-0.9 MEQ/L-%-% IV SOLN
INTRAVENOUS | Status: DC
Start: 2014-11-25 — End: 2014-11-29
  Administered 2014-11-25 – 2014-11-26 (×4): via INTRAVENOUS
  Administered 2014-11-27 (×2): 125 mL/h via INTRAVENOUS
  Administered 2014-11-27 – 2014-11-28 (×3): via INTRAVENOUS
  Administered 2014-11-28: 125 mL/h via INTRAVENOUS
  Administered 2014-11-29: 05:00:00 via INTRAVENOUS
  Filled 2014-11-25 (×14): qty 1000

## 2014-11-25 MED ORDER — TISSEEL VH 10 ML EX KIT
PACK | CUTANEOUS | Status: AC
  Filled 2014-11-25: qty 2

## 2014-11-25 MED ORDER — HYDROMORPHONE HCL 1 MG/ML IJ SOLN
INTRAMUSCULAR | Status: AC
  Filled 2014-11-25: qty 1

## 2014-11-25 MED ORDER — LIDOCAINE HCL (CARDIAC) 20 MG/ML IV SOLN
INTRAVENOUS | Status: AC
Start: 2014-11-25 — End: 2014-11-25
  Filled 2014-11-25: qty 5

## 2014-11-25 MED ORDER — CEFOXITIN SODIUM 2 G IV SOLR
INTRAVENOUS | Status: AC
  Filled 2014-11-25: qty 2

## 2014-11-25 MED ORDER — LACTATED RINGERS IV SOLN
INTRAVENOUS | Status: DC | PRN
  Administered 2014-11-25: 07:00:00 via INTRAVENOUS
  Administered 2014-11-25: 1000 mL
  Administered 2014-11-25: 09:00:00 via INTRAVENOUS

## 2014-11-25 SURGICAL SUPPLY — 80 items
APL SRG 32X5 SNPLK LF DISP (MISCELLANEOUS) ×1
APPLICATOR COTTON TIP 6IN STRL (MISCELLANEOUS) ×2 IMPLANT
APPLIER CLIP ROT 13.4 12 LRG (CLIP)
APR CLP LRG 13.4X12 ROT 20 MLT (CLIP)
BAG SPEC RTRVL LRG 6X4 10 (ENDOMECHANICALS) ×1
BLADE SURG SZ11 CARB STEEL (BLADE) ×3 IMPLANT
CABLE HIGH FREQUENCY MONO STRZ (ELECTRODE) ×3 IMPLANT
CHLORAPREP W/TINT 26ML (MISCELLANEOUS) ×3 IMPLANT
CLIP APPLIE ROT 13.4 12 LRG (CLIP) IMPLANT
CLIP SUT LAPRA TY ABSORB (SUTURE) ×6 IMPLANT
CUTTER FLEX LINEAR 45M (STAPLE) ×3 IMPLANT
DECANTER SPIKE VIAL GLASS SM (MISCELLANEOUS) ×3 IMPLANT
DEVICE SUTURE ENDOST 10MM (ENDOMECHANICALS) IMPLANT
DISSECTOR BLUNT TIP ENDO 5MM (MISCELLANEOUS) IMPLANT
DRAIN PENROSE 18X1/4 LTX STRL (WOUND CARE) ×3 IMPLANT
DRAPE CAMERA CLOSED 9X96 (DRAPES) ×3 IMPLANT
ELECT REM PT RETURN 9FT ADLT (ELECTROSURGICAL) ×3
ELECTRODE REM PT RTRN 9FT ADLT (ELECTROSURGICAL) ×1 IMPLANT
GAUZE SPONGE 4X4 12PLY STRL (GAUZE/BANDAGES/DRESSINGS) ×3 IMPLANT
GAUZE SPONGE 4X4 16PLY XRAY LF (GAUZE/BANDAGES/DRESSINGS) ×3 IMPLANT
GLOVE BIOGEL PI IND STRL 7.5 (GLOVE) ×1 IMPLANT
GLOVE BIOGEL PI INDICATOR 7.5 (GLOVE) ×2
GLOVE ECLIPSE 7.5 STRL STRAW (GLOVE) ×3 IMPLANT
GOWN STRL REUS W/TWL XL LVL3 (GOWN DISPOSABLE) ×6 IMPLANT
HEMOSTAT SURGICEL 4X8 (HEMOSTASIS) IMPLANT
HOVERMATT SINGLE USE (MISCELLANEOUS) ×3 IMPLANT
KIT BASIN OR (CUSTOM PROCEDURE TRAY) ×3 IMPLANT
KIT GASTRIC LAVAGE 34FR ADT (SET/KITS/TRAYS/PACK) ×3 IMPLANT
LIQUID BAND (GAUZE/BANDAGES/DRESSINGS) ×3 IMPLANT
NDL SPNL 22GX3.5 QUINCKE BK (NEEDLE) ×1 IMPLANT
NEEDLE SPNL 22GX3.5 QUINCKE BK (NEEDLE) ×3 IMPLANT
PACK CARDIOVASCULAR III (CUSTOM PROCEDURE TRAY) ×3 IMPLANT
PEN SKIN MARKING BROAD (MISCELLANEOUS) ×3 IMPLANT
POUCH SPECIMEN RETRIEVAL 10MM (ENDOMECHANICALS) ×2 IMPLANT
RELOAD 45 VASCULAR/THIN (ENDOMECHANICALS) ×3 IMPLANT
RELOAD ENDO STITCH 2.0 (ENDOMECHANICALS)
RELOAD STAPLE 45 2.5 WHT GRN (ENDOMECHANICALS) ×1 IMPLANT
RELOAD STAPLE 45 3.5 BLU ETS (ENDOMECHANICALS) ×1 IMPLANT
RELOAD STAPLE 60 2.6 WHT THN (STAPLE) ×1 IMPLANT
RELOAD STAPLE 60 3.6 BLU REG (STAPLE) ×2 IMPLANT
RELOAD STAPLE 60 3.8 GOLD REG (STAPLE) ×1 IMPLANT
RELOAD STAPLE TA45 3.5 REG BLU (ENDOMECHANICALS) ×6 IMPLANT
RELOAD STAPLER BLUE 60MM (STAPLE) ×2 IMPLANT
RELOAD STAPLER GOLD 60MM (STAPLE) ×1 IMPLANT
RELOAD STAPLER WHITE 60MM (STAPLE) ×2 IMPLANT
RELOAD SUT SNGL STCH ABSRB 2-0 (ENDOMECHANICALS) IMPLANT
RELOAD SUT SNGL STCH BLK 2-0 (ENDOMECHANICALS) IMPLANT
SCISSORS LAP 5X45 EPIX DISP (ENDOMECHANICALS) ×3 IMPLANT
SEALANT SURGICAL APPL DUAL CAN (MISCELLANEOUS) ×3 IMPLANT
SET IRRIG TUBING LAPAROSCOPIC (IRRIGATION / IRRIGATOR) ×3 IMPLANT
SHEARS CURVED HARMONIC AC 45CM (MISCELLANEOUS) ×3 IMPLANT
SLEEVE ADV FIXATION 12X100MM (TROCAR) ×6 IMPLANT
SOLUTION ANTI FOG 6CC (MISCELLANEOUS) ×3 IMPLANT
STAPLE ECHEON FLEX 60 POW ENDO (STAPLE) IMPLANT
STAPLER ECHELON LONG 60 440 (INSTRUMENTS) IMPLANT
STAPLER RELOAD BLUE 60MM (STAPLE) ×6
STAPLER RELOAD GOLD 60MM (STAPLE) ×3
STAPLER RELOAD WHITE 60MM (STAPLE) ×6
STAPLER VISISTAT 35W (STAPLE) ×3 IMPLANT
SUT DEVICE BRAIDED 0X39 (SUTURE) IMPLANT
SUT DVC SILK 2.0X39 (SUTURE) ×15 IMPLANT
SUT DVC VICRYL PGA 2.0X39 (SUTURE) ×15 IMPLANT
SUT MNCRL AB 4-0 PS2 18 (SUTURE) ×3 IMPLANT
SUT RELOAD ENDO STITCH 2 48X1 (ENDOMECHANICALS)
SUT RELOAD ENDO STITCH 2.0 (ENDOMECHANICALS)
SUT VIC AB 2-0 SH 27 (SUTURE)
SUT VIC AB 2-0 SH 27X BRD (SUTURE) IMPLANT
SUTURE RELOAD END STTCH 2 48X1 (ENDOMECHANICALS) IMPLANT
SUTURE RELOAD ENDO STITCH 2.0 (ENDOMECHANICALS) IMPLANT
SYR 20CC LL (SYRINGE) ×6 IMPLANT
SYR 50ML LL SCALE MARK (SYRINGE) ×3 IMPLANT
TOWEL OR 17X26 10 PK STRL BLUE (TOWEL DISPOSABLE) ×3 IMPLANT
TOWEL OR NON WOVEN STRL DISP B (DISPOSABLE) ×3 IMPLANT
TRAY FOLEY W/METER SILVER 14FR (SET/KITS/TRAYS/PACK) ×3 IMPLANT
TROCAR ADV FIXATION 12X100MM (TROCAR) ×3 IMPLANT
TROCAR ADV FIXATION 5X100MM (TROCAR) ×3 IMPLANT
TROCAR BLADELESS OPT 5 100 (ENDOMECHANICALS) ×3 IMPLANT
TROCAR XCEL 12X100 BLDLESS (ENDOMECHANICALS) ×3 IMPLANT
TUBING ENDO SMARTCAP PENTAX (MISCELLANEOUS) ×3 IMPLANT
TUBING FILTER THERMOFLATOR (ELECTROSURGICAL) ×3 IMPLANT

## 2014-11-25 NOTE — Anesthesia Procedure Notes (Signed)
Procedure Name: Intubation Date/Time: 11/25/2014 7:54 AM Performed by: Pilar Grammes Pre-anesthesia Checklist: Patient identified, Emergency Drugs available, Suction available, Patient being monitored and Timeout performed Patient Re-evaluated:Patient Re-evaluated prior to inductionOxygen Delivery Method: Circle system utilized Preoxygenation: Pre-oxygenation with 100% oxygen Intubation Type: IV induction Ventilation: Mask ventilation without difficulty Laryngoscope Size: 3 and Mac Grade View: Grade I Tube type: Oral Tube size: 7.5 mm Number of attempts: 1 Airway Equipment and Method: Stylet Placement Confirmation: positive ETCO2,  ETT inserted through vocal cords under direct vision,  CO2 detector and breath sounds checked- equal and bilateral Tube secured with: Tape Dental Injury: Teeth and Oropharynx as per pre-operative assessment

## 2014-11-25 NOTE — Op Note (Signed)
Preop diagnosis: Morbid obesity  Postop diagnosis: Morbid obesity  Body mass index is 41.5 kg/(m^2).  Surgical procedure: Laparoscopic Roux-en-Y gastric bypass  Surgeon: Marland Kitchen T.Avid Guillette M.D.  Asst.: Alphonsa Overall M.D.  Anesthesia: General  Complications:  None  EBL: Minimal  Drains: None  Disposition: PACU in good condition  Description of procedure: Patient is brought to the operating room and general anesthesia induced. She had received preoperative broad-spectrum IV antibiotics and subcutaneous heparin. The abdomen was widely sterilely prepped and draped. Patient timeout was performed and correct patient and procedure confirmed. Access was obtained with a 12 mm Optiview trocar in the left upper quadrant and pneumoperitoneum established without difficulty. Under direct vision 12 mm trocars were placed laterally in the right upper quadrant, right upper quadrant midclavicular line, and to the left and above the umbilicus for the camera port. A 5 mm trocar was placed laterally in the left upper quadrant. The omentum was brought into the upper abdomen and the transverse mesocolon elevated and the ligament of Treitz clearly identified. A 40 cm biliopancreatic limb was then carefully measured from the ligament of Treitz. The small intestine was divided at this point with a single firing of the white load linear stapler. A Penrose drain was sutured to the end of the Roux-en-Y limb for later identification. A 100 cm Roux-en-Y limb was then carefully measured. At this point a side-to-side anastomosis was created between the Roux limb and the end of the biliopancreatic limb. This was accomplished with a single firing of the 45 mm white load linear stapler. The common enterotomy was closed with a running 2-0 Vicryl begun at either end of the enterotomy and tied centrally. The mesenteric defect was then closed with running 2-0 silk. The omentum was then divided with the harmonic scalpel up towards the  transverse colon to allow mobility of the Roux limb toward the gastric pouch. The patient was then placed in steep reversed Trendelenburg. Through a 5 mm subxiphoid site the [facility] retractor was placed and the left lobe of the liver elevated with excellent exposure of the upper stomach and hiatus. The angle of Hiss was then mobilized with the harmonic scalpel. A 4 cm gastric pouch was then carefully measured along the lesser curve of the stomach. Dissection was carried along the lesser curve at this point with the Harmonic scalpel working carefully back toward the lesser sac at right angles to the lesser curve. The free lesser sac was then entered. After being sure all tubes were removed from the stomach an initial firing of the gold load 60 mm linear stapler was fired at right angles across the lesser curve for about 4 cm. The gastric pouch was further mobilized posteriorly and then the pouch was completed with 2 further firings of the 60 mm blue load linear stapler up through the previously dissected angle of His. It was ensured that the pouch was completely mobilized away from the gastric remnant. This created a nice tubular 4-5 cm gastric pouch. The staple line of the gastric remnant was then oversewn with 2-0 silk for hemostasis. The Roux limb was then brought up in an antecolic fashion with the candycane facing to the patient's left without undue tension. The gastrojejunostomy was created with an initial posterior row of 2-0 Vicryl between the Roux limb and the staple line of the gastric pouch. The seemed to be some excess tension if the Roux limb was brought up near its and so we backed about 15 cm proximally for the anastomosis.Enterotomies  were then made in the gastric pouch and the Roux limb with the harmonic scalpel and at approximately 2-2-1/2 cm anastomosis was created with a single firing of the blue load linear stapler. The staple line was inspected and was intact without bleeding. The common  enterotomy was then closed with running 2-0 Vicryl begun at either end and tied centrally. The wall tube was then easily passed through the anastomosis and an outer anterior layer of running 2-0 Vicryl was placed. The Ewald tube was removed. With the outlet of the gastrojejunostomy clamped and under saline irrigation the assistant performed upper endoscopy and with the gastric pouch tensely distended with air there was no evidence of leak. The pouch was desufflated. The Terance Hart defect was closed with running 2-0 silk. I then resected the long blind end of the Roux limb dividing the mesentery with the Harmonic scalpel and then dividing the small bowel with a additional 60 mm white load removing about 10-15 cm. This was placed in an Endo Catch bag and brought out through one of the 12 mm trocar sites. The abdomen was inspected for any evidence of bleeding or bowel injury and everything looked fine. The Nathanson retractor was removed under direct vision after coating the anastomosis with Tisseel tissue sealant. All CO2 was evacuated and trochars removed. Skin incisions were closed with staples. Sponge needle and instrument counts were correct. The patient was taken to the PACU in good condition.     Edward Jolly MD, FACS  11/25/2014, 10:36 AM

## 2014-11-25 NOTE — Op Note (Signed)
Name:  Kristen Delacruz MRN: [id number] Date of Surgery: 11/25/2014  Preop Diagnosis:  Morbid Obesity, S/P RYGB  Postop Diagnosis:  Morbid Obesity (Weight - 260, BMI - 43.3), S/P RYGB  Procedure:  Upper endoscopy  (Intraoperative)  Surgeon:  Alphonsa Overall, M.D.  Anesthesia:  GET  Indications for procedure: Kristen Delacruz is a 49 y.o. female whose primary care physician is FIERY, HUBERT L, MD and has completed a Roux-en-Y gastric bypass today by Dr. Excell Seltzer.  I am doing an intraoperative upper endoscopy to evaluate the gastric pouch and the gastro-jejunal anastomosis.  Operative Note: The patient is under general anesthesia.  Dr. Excell Seltzer is laparoscoping the patient while I do an upper endoscopy to evaluate the stomach pouch and gastrojejunal anastomosis.  With the patient intubated, I passed the Pentax endoscope without difficulty down the esophagus.  The esophago-gastric junction was at 38 cm.  The gastro-jejunal anastomosis was at 44 cm.  The mucosa of the stomach looked viable and the staple line was intact without bleeding.  The gastro-jejunal anastomosis looked okay.  While I insufflated the stomach pouch with air, Dr. Excell Seltzer clamped off the efferent limb of the jejunum.  He then flooded the upper abdomen with saline to put the gastric pouch and gastro-jejunal anastomosis under saline.  There was no bubbling or evidence of a leak.    The scope was then withdrawn.  The esophagus was unremarkable and the patient tolerated the endoscopy without difficulty.  Alphonsa Overall, MD, [facility] Surgery Pager: [phone] Office phone:  [phone]

## 2014-11-25 NOTE — Transfer of Care (Signed)
Immediate Anesthesia Transfer of Care Note  Patient: Kristen Delacruz  Procedure(s) Performed: Procedure(s): LAPAROSCOPIC ROUX-EN-Y GASTRIC BYPASS (N/A)  Patient Location: PACU  Anesthesia Type:General  Level of Consciousness: alert , oriented and patient cooperative  Airway & Oxygen Therapy: Patient Spontanous Breathing and Patient connected to face mask oxygen  Post-op Assessment: Report given to RN, Post -op Vital signs reviewed and stable and Patient moving all extremities X 4  Post vital signs: stable  Last Vitals:  Filed Vitals:   11/25/14 0524  BP: 121/75  Pulse: 74  Temp: 37 C  Resp: 18    Complications: No apparent anesthesia complications

## 2014-11-25 NOTE — H&P (Signed)
  History of Present Illness Marland Kitchen T. Adarian Bur MD; 11/07/2014 10:52 AM) Patient words: pre-op.  The patient is a 49 year old female who presents with obesity. She returns for a preop visit prior to planned laparoscopic Roux-en-Y gastric bypass. She has successfully completed her preoperative workup. No concerns with psychologic or nutrition evaluation. We reviewed her lab work and imaging that was unremarkable. She generally has been feeling well.   Other Problems Edward Jolly, MD; 11/07/2014 10:52 AM) Sleep Apnea General anesthesia - complications Migraine Headache MORBID OBESITY (278.01  E66.01) Gastroesophageal Reflux Disease Back Pain Bladder Problems Asthma  Past Surgical History Edward Jolly, MD; 11/07/2014 10:52 AM) Cesarean Section - Multiple Gallbladder Surgery - Laparoscopic Hysterectomy (not due to cancer) - Partial Knee Surgery Right.  Diagnostic Studies History Edward Jolly, MD; 11/07/2014 10:52 AM) Colonoscopy never Mammogram 1-3 years ago  Allergies Elbert Ewings, CMA; 11/07/2014 10:32 AM) Advair Diskus *ANTIASTHMATIC AND BRONCHODILATOR AGENTS* Rash, Swelling.  Medication History Elbert Ewings, CMA; 11/07/2014 10:32 AM) OxyCODONE HCl (5MG /5ML Solution, 5-10 Milliliter Oral every four hours, as needed, Taken starting 11/06/2014) Active. Phentermine HCl (15MG  Capsule, Oral daily) Active. Furosemide (40MG  Tablet, Oral daily) Active. Xopenex Concentrate (1.25MG /0.5ML Nebulized Soln, Inhalation as needed) Active. Omeprazole (20MG  Capsule DR, Oral as needed) Active. Medications Reconciled  Pregnancy / Birth History Edward Jolly, MD; 11/07/2014 10:52 AM) Age at menarche 45 years. Gravida 3 Maternal age 76-20 Para 3  Vitals Elbert Ewings CMA; 11/07/2014 10:32 AM) 11/07/2014 10:32 AM Weight: 260 lb Height: 65in Body Surface Area: 2.33 m Body Mass Index: 43.27 kg/m Temp.: 98.68F(Oral)  Pulse: 72  (Regular)  Resp.: 16 (Unlabored)  BP: 132/78 (Sitting, Left Arm, Standard)    Physical Exam Marland Kitchen T. Uniqua Kihn MD; 11/07/2014 10:53 AM) The physical exam findings are as follows: Note:General: Alert, obese Caucasian female, in no distress Skin: Warm and dry without rash or infection. HEENT: No palpable masses or thyromegaly. Sclera nonicteric. Pupils equal round and reactive. Oropharynx clear. Lymph nodes: No cervical, supraclavicular, or inguinal nodes palpable. Lungs: Breath sounds clear and equal. No wheezing or increased work of breathing. Cardiovascular: Regular rate and rhythm without murmer. No JVD or edema. Peripheral pulses intact. No carotid bruits. Abdomen: Nondistended. Soft and nontender. No masses palpable. No organomegaly. No palpable hernias. Extremities: No edema or joint swelling or deformity. No chronic venous stasis changes. Neurologic: Alert and fully oriented. Gait normal. No focal weakness. Psychiatric: Normal mood and affect. Thought content appropriate with normal judgement and insight    Assessment & Plan Marland Kitchen T. Fardowsa Authier MD; 11/07/2014 10:54 AM) MORBID OBESITY (278.01  E66.01) Impression: Patient with progressive morbid obesity unresponsive to multiple efforts at medical management who presents with a BMI of 42.5 and comorbidities of GERD, chronic joint pain and stress urinary incontinence. She has completed her preoperative workup and ready to proceed with surgery. We reviewed the consent form and all her questions were answered. Current Plans  Continued OxyCODONE HCl 5MG /5ML, 5-10 Milliliter every four hours, as needed, 200 Milliliter, 11/07/2014, No Refill.

## 2014-11-25 NOTE — Progress Notes (Signed)
Patient alert and oriented,op day.  Provided support and encouragement.  Encouraged pulmonary toilet and ambulation.  Educated family re: pain medication and nausea medication are as needed and will need to ask for it.  All questions answered.  Will continue to monitor.

## 2014-11-25 NOTE — Anesthesia Postprocedure Evaluation (Signed)
  Anesthesia Post-op Note  Patient: Kristen Delacruz  Procedure(s) Performed: Procedure(s) (LRB): LAPAROSCOPIC ROUX-EN-Y GASTRIC BYPASS (N/A)  Patient Location: PACU  Anesthesia Type: General  Level of Consciousness: awake and alert   Airway and Oxygen Therapy: Patient Spontanous Breathing  Post-op Pain: mild  Post-op Assessment: Post-op Vital signs reviewed, Patient's Cardiovascular Status Stable, Respiratory Function Stable, Patent Airway and No signs of Nausea or vomiting  Last Vitals:  Filed Vitals:   11/25/14 1230  BP: 106/58  Pulse: 70  Temp: 36.8 C  Resp: 17    Post-op Vital Signs: stable   Complications: No apparent anesthesia complications

## 2014-11-25 NOTE — Interval H&P Note (Signed)
History and Physical Interval Note:  11/25/2014 7:08 AM  Kristen Delacruz  has presented today for surgery, with the diagnosis of morbid obesity  The various methods of treatment have been discussed with the patient and family. After consideration of risks, benefits and other options for treatment, the patient has consented to  Procedure(s): LAPAROSCOPIC ROUX-EN-Y GASTRIC BYPASS (N/A) as a surgical intervention .  The patient's history has been reviewed, patient examined, no change in status, stable for surgery.  I have reviewed the patient's chart and labs.  Questions were answered to the patient's satisfaction.     Sophiya Morello T

## 2014-11-25 NOTE — Anesthesia Preprocedure Evaluation (Addendum)
Anesthesia Evaluation  Patient identified by MRN, date of birth, ID band Patient awake    Reviewed: Allergy & Precautions, NPO status , Patient's Chart, lab work & pertinent test results  History of Anesthesia Complications (+) PONV and history of anesthetic complications  Airway Mallampati: II  TM Distance: >3 FB Neck ROM: Full   Comment: Right jaw pain after dental work last week. Dental no notable dental hx. (+)    Pulmonary asthma , sleep apnea , pneumonia -, resolved,  breath sounds clear to auscultation  Pulmonary exam normal       Cardiovascular negative cardio ROS  Rhythm:Regular Rate:Normal     Neuro/Psych negative neurological ROS  negative psych ROS   GI/Hepatic Neg liver ROS, hiatal hernia, GERD-  ,  Endo/Other  Morbid obesity  Renal/GU negative Renal ROS  negative genitourinary   Musculoskeletal negative musculoskeletal ROS (+)   Abdominal (+) + obese,   Peds negative pediatric ROS (+)  Hematology negative hematology ROS (+)   Anesthesia Other Findings   Reproductive/Obstetrics negative OB ROS                            Anesthesia Physical Anesthesia Plan  ASA: III  Anesthesia Plan: General   Post-op Pain Management:    Induction: Intravenous  Airway Management Planned: Oral ETT  Additional Equipment:   Intra-op Plan:   Post-operative Plan: Extubation in OR  Informed Consent: I have reviewed the patients History and Physical, chart, labs and discussed the procedure including the risks, benefits and alternatives for the proposed anesthesia with the patient or authorized representative who has indicated his/her understanding and acceptance.   Dental advisory given  Plan Discussed with: CRNA  Anesthesia Plan Comments:         Anesthesia Quick Evaluation

## 2014-11-26 ENCOUNTER — Inpatient Hospital Stay (HOSPITAL_COMMUNITY): Payer: 59

## 2014-11-26 ENCOUNTER — Encounter (HOSPITAL_COMMUNITY): Payer: Self-pay | Admitting: General Surgery

## 2014-11-26 LAB — CBC WITH DIFFERENTIAL/PLATELET
Basophils Absolute: 0 10*3/uL (ref 0.0–0.1)
Basophils Relative: 0 % (ref 0–1)
Eosinophils Absolute: 0 10*3/uL (ref 0.0–0.7)
Eosinophils Relative: 0 % (ref 0–5)
HCT: 38.5 % (ref 36.0–46.0)
Hemoglobin: 12.4 g/dL (ref 12.0–15.0)
Lymphocytes Relative: 13 % (ref 12–46)
Lymphs Abs: 1.8 10*3/uL (ref 0.7–4.0)
MCH: 30.1 pg (ref 26.0–34.0)
MCHC: 32.2 g/dL (ref 30.0–36.0)
MCV: 93.4 fL (ref 78.0–100.0)
Monocytes Absolute: 1 10*3/uL (ref 0.1–1.0)
Monocytes Relative: 7 % (ref 3–12)
Neutro Abs: 10.8 10*3/uL — ABNORMAL HIGH (ref 1.7–7.7)
Neutrophils Relative %: 80 % — ABNORMAL HIGH (ref 43–77)
Platelets: 187 10*3/uL (ref 150–400)
RBC: 4.12 MIL/uL (ref 3.87–5.11)
RDW: 13.9 % (ref 11.5–15.5)
WBC: 13.7 10*3/uL — ABNORMAL HIGH (ref 4.0–10.5)

## 2014-11-26 LAB — HEMOGLOBIN AND HEMATOCRIT, BLOOD
HCT: 39.1 % (ref 36.0–46.0)
HEMOGLOBIN: 12.3 g/dL (ref 12.0–15.0)

## 2014-11-26 MED ORDER — IOHEXOL 300 MG/ML  SOLN
50.0000 mL | Freq: Once | INTRAMUSCULAR | Status: AC | PRN
Start: 2014-11-26 — End: 2014-11-26
  Administered 2014-11-26: 10 mL via ORAL

## 2014-11-26 MED ORDER — FENTANYL CITRATE (PF) 100 MCG/2ML IJ SOLN
25.0000 ug | INTRAMUSCULAR | Status: DC | PRN
  Administered 2014-11-26 – 2014-11-28 (×14): 25 ug via INTRAVENOUS
  Filled 2014-11-26 (×14): qty 2

## 2014-11-26 MED ORDER — PANTOPRAZOLE SODIUM 40 MG IV SOLR
40.0000 mg | Freq: Every day | INTRAVENOUS | Status: DC
  Administered 2014-11-26: 40 mg via INTRAVENOUS
  Filled 2014-11-26: qty 40

## 2014-11-26 MED ORDER — PANTOPRAZOLE SODIUM 40 MG IV SOLR
40.0000 mg | Freq: Two times a day (BID) | INTRAVENOUS | Status: DC
  Administered 2014-11-26 – 2014-11-28 (×5): 40 mg via INTRAVENOUS
  Filled 2014-11-26 (×7): qty 40

## 2014-11-26 NOTE — Progress Notes (Signed)
Patient alert and oriented, Post op day 1.  Provided support and encouragement.  Encouraged pulmonary toilet, ambulation and small sips of liquids.  All questions answered.  Will continue to monitor. 

## 2014-11-26 NOTE — Care Management Note (Addendum)
Case Management Note  Patient Details  Name: Kristen Delacruz MRN: [id number] Date of Birth: 1965-09-09  Subjective/Objective:                   LAPAROSCOPIC ROUX-EN-Y GASTRIC BYPASS (N/A) Action/Plan: Discharge planning Expected Discharge Date:  11/28/14               Expected Discharge Plan:  Home/Self Care  In-House Referral:     Discharge planning Services  CM Consult  Post Acute Care Choice:    Choice offered to:     DME Arranged:    DME Agency:     HH Arranged:    HH Agency:     Status of Service:  Completed, signed off  Medicare Important Message Given:    Date Medicare IM Given:    Medicare IM give by:    Date Additional Medicare IM Given:    Additional Medicare Important Message give by:     If discussed at [place] of Stay Meetings, dates discussed:    Additional Comments: Utilization Review Complete Dellie Catholic, RN 11/26/2014, 2:59 PM

## 2014-11-26 NOTE — Progress Notes (Signed)
Patient ID: Kristen Delacruz, female   DOB: 09/21/1965, 49 y.o.   MRN: [id number] 1 Day Post-Op  Subjective: Having a lot of nausea and dry heaves. This has been a problem for her after essentially every general anesthesia. Also left upper quadrant and right upper quadrant abdominal pain which is better after meds but returns  Objective: Vital signs in last 24 hours: Temp:  [97.5 F (36.4 C)-98.6 F (37 C)] 98.4 F (36.9 C) (05/03 1000) Pulse Rate:  [59-78] 67 (05/03 1000) Resp:  [14-27] 16 (05/03 1000) BP: (98-134)/(50-75) 120/74 mmHg (05/03 1000) SpO2:  [94 %-100 %] 99 % (05/03 1000) Last BM Date: 11/25/14  Intake/Output from previous day: 05/02 0701 - 05/03 0700 In: 2150 [I.V.:2150] Out: 1165 [Urine:1140; Blood:25] Intake/Output this shift: Total I/O In: 1 [P.O.:1] Out: 250 [Urine:250]  General appearance: alert, cooperative and mild distress Resp: clear to auscultation bilaterally GI: minimal upper abdominal tenderness without guarding. Incision/Wound: clean and dry  Lab Results:   Recent Labs  11/25/14 1850 11/26/14 0455  WBC  --  13.7*  HGB 13.2 12.4  HCT 39.3 38.5  PLT  --  187   BMET No results for input(s): NA, K, CL, CO2, GLUCOSE, BUN, CREATININE, CALCIUM in the last 72 hours.   Studies/Results: Dg Ugi W/water Sol Cm  11/26/2014   CLINICAL DATA:  49 year old female status post Roux-en-Y gastric bypass.  EXAM: WATER SOLUBLE UPPER GI SERIES  TECHNIQUE: Single-column upper GI series was performed using water soluble contrast.  CONTRAST:  15mL OMNIPAQUE IOHEXOL 300 MG/ML  SOLN  COMPARISON:  No priors.  FLUOROSCOPY TIME:  If the device does not provide the exposure index:  Fluoroscopy Time (in minutes and seconds):  1 minutes and 6 seconds  Number of Acquired Images:  25  FINDINGS: Preprocedure KUB demonstrated some surgical clips in the right upper quadrant of the abdomen. Bowel gas pattern was nonobstructive.  Subsequently, following ingestion of Omnipaque 300, the  small gastric remnant was identified, with rapid transit of contrast into the small bowel. No leakage of contrast was noted at any time during the examination, and no evidence of obstruction. Extensive gastroesophageal reflux was observed throughout the examination.  IMPRESSION: 1. Expected postoperative appearance in patient status post Roux-en-Y gastric bypass, without complicating features, as above.   Electronically Signed   By: Vinnie Langton M.D.   On: 11/26/2014 09:40    Anti-infectives: Anti-infectives    Start     Dose/Rate Route Frequency Ordered Stop   11/25/14 0518  cefOXitin (MEFOXIN) 2 g in dextrose 5 % 50 mL IVPB     2 g 100 mL/hr over 30 Minutes Intravenous On call to O.R. 11/25/14 3810 11/25/14 0915      Assessment/Plan: s/p Procedure(s): LAPAROSCOPIC ROUX-EN-Y GASTRIC BYPASS Postoperative nausea and vomiting which is a recurrent problem for her. Meds ordered. A little more pain than typical but her abdomen seems benign, vital signs stable and swallow study is negative. Start ice and sips of water which she feels will help her nausea   LOS: 1 day    Kristen Delacruz 11/26/2014

## 2014-11-27 LAB — CBC WITH DIFFERENTIAL/PLATELET
BASOS PCT: 0 % (ref 0–1)
Basophils Absolute: 0 10*3/uL (ref 0.0–0.1)
EOS ABS: 0.1 10*3/uL (ref 0.0–0.7)
EOS PCT: 1 % (ref 0–5)
HEMATOCRIT: 36.8 % (ref 36.0–46.0)
Hemoglobin: 11.6 g/dL — ABNORMAL LOW (ref 12.0–15.0)
Lymphocytes Relative: 18 % (ref 12–46)
Lymphs Abs: 1.9 10*3/uL (ref 0.7–4.0)
MCH: 30 pg (ref 26.0–34.0)
MCHC: 31.5 g/dL (ref 30.0–36.0)
MCV: 95.1 fL (ref 78.0–100.0)
Monocytes Absolute: 1 10*3/uL (ref 0.1–1.0)
Monocytes Relative: 9 % (ref 3–12)
Neutro Abs: 7.5 10*3/uL (ref 1.7–7.7)
Neutrophils Relative %: 72 % (ref 43–77)
Platelets: 171 10*3/uL (ref 150–400)
RBC: 3.87 MIL/uL (ref 3.87–5.11)
RDW: 14.3 % (ref 11.5–15.5)
WBC: 10.4 10*3/uL (ref 4.0–10.5)

## 2014-11-27 NOTE — Progress Notes (Signed)
Patient ID: Kristen Delacruz, female   DOB: Sep 16, 1965, 49 y.o.   MRN: [id number] 2 Days Post-Op  Subjective: Still not feeling well. She has nausea and lightheadedness when getting up although does pretty well when lying still. Still epigastric pain which is no worse but not a lot better. No flatus yet.  Objective: Vital signs in last 24 hours: Temp:  [98.2 F (36.8 C)-98.8 F (37.1 C)] 98.6 F (37 C) (05/04 0529) Pulse Rate:  [62-72] 62 (05/04 0529) Resp:  [16-18] 18 (05/04 0529) BP: (117-146)/(65-83) 137/74 mmHg (05/04 0529) SpO2:  [99 %-100 %] 100 % (05/04 0529) Last BM Date: 11/25/14  Intake/Output from previous day: 05/03 0701 - 05/04 0700 In: 4626 [P.O.:1; I.V.:4625] Out: 825 [Urine:825] Intake/Output this shift:    General appearance: alert, cooperative, no distress and  appears more comfortable than yesterday Resp: no wheezing or increased work of breathing GI: minimal epigastric tenderness without guarding Incision/Wound: clean and dry  Lab Results:   Recent Labs  11/26/14 0455 11/26/14 1540 11/27/14 0611  WBC 13.7*  --  10.4  HGB 12.4 12.3 11.6*  HCT 38.5 39.1 36.8  PLT 187  --  171   BMET No results for input(s): NA, K, CL, CO2, GLUCOSE, BUN, CREATININE, CALCIUM in the last 72 hours.   Studies/Results: Dg Ugi W/water Sol Cm  11/26/2014   CLINICAL DATA:  49 year old female status post Roux-en-Y gastric bypass.  EXAM: WATER SOLUBLE UPPER GI SERIES  TECHNIQUE: Single-column upper GI series was performed using water soluble contrast.  CONTRAST:  23mL OMNIPAQUE IOHEXOL 300 MG/ML  SOLN  COMPARISON:  No priors.  FLUOROSCOPY TIME:  If the device does not provide the exposure index:  Fluoroscopy Time (in minutes and seconds):  1 minutes and 6 seconds  Number of Acquired Images:  25  FINDINGS: Preprocedure KUB demonstrated some surgical clips in the right upper quadrant of the abdomen. Bowel gas pattern was nonobstructive.  Subsequently, following ingestion of Omnipaque  300, the small gastric remnant was identified, with rapid transit of contrast into the small bowel. No leakage of contrast was noted at any time during the examination, and no evidence of obstruction. Extensive gastroesophageal reflux was observed throughout the examination.  IMPRESSION: 1. Expected postoperative appearance in patient status post Roux-en-Y gastric bypass, without complicating features, as above.   Electronically Signed   By: Vinnie Langton M.D.   On: 11/26/2014 09:40    Anti-infectives: Anti-infectives    Start     Dose/Rate Route Frequency Ordered Stop   11/25/14 0518  cefOXitin (MEFOXIN) 2 g in dextrose 5 % 50 mL IVPB     2 g 100 mL/hr over 30 Minutes Intravenous On call to O.R. 11/25/14 1694 11/25/14 0915      Assessment/Plan: s/p Procedure(s): LAPAROSCOPIC ROUX-EN-Y GASTRIC BYPASS Postoperative nausea. She has a history of this and I think this is more related to this history and to any significant risk of postoperative complication. Her swallow study was negative, vital signs normal and CBC normalized and her abdomen seems benign on exam. Not yet ready for discharge. Continue liquids as tolerated and start protein shakes as tolerated.   LOS: 2 days    Kristen Delacruz T 11/27/2014

## 2014-11-27 NOTE — Plan of Care (Signed)
Problem: Food- and Nutrition-Related Knowledge Deficit (NB-1.1) Goal: Nutrition education Formal process to instruct or train a patient/client in a skill or to impart knowledge to help patients/clients voluntarily manage or modify food choices and eating behavior to maintain or improve health. Outcome: Completed/Met Date Met:  11/27/14 Nutrition Education Note  Received consult for diet education per DROP protocol.   Discussed 2 week post op diet with pt. Emphasized that liquids must be non carbonated, non caffeinated, and sugar free. Fluid goals discussed. Pt to follow up with outpatient bariatric RD for further diet progression after 2 weeks. Multivitamins and minerals also reviewed. Teach back method used, pt expressed understanding, expect good compliance.   Diet: First 2 Weeks  You will see the nutritionist about two (2) weeks after your surgery. The nutritionist will increase the types of foods you can eat if you are handling liquids well:  If you have severe vomiting or nausea and cannot handle clear liquids lasting longer than 1 day, call your surgeon  Protein Shake  Drink at least 2 ounces of shake 5-6 times per day  Each serving of protein shakes (usually 8 - 12 ounces) should have a minimum of:  15 grams of protein  And no more than 5 grams of carbohydrate  Goal for protein each day:  Men = 80 grams per day  Women = 60 grams per day  Protein powder may be added to fluids such as non-fat milk or Lactaid milk or Soy milk (limit to 35 grams added protein powder per serving)   Hydration  Slowly increase the amount of water and other clear liquids as tolerated (See Acceptable Fluids)  Slowly increase the amount of protein shake as tolerated  Sip fluids slowly and throughout the day  May use sugar substitutes in small amounts (no more than 6 - 8 packets per day; i.e. Splenda)   Fluid Goal  The first goal is to drink at least 8 ounces of protein shake/drink per day (or as directed  by the nutritionist); some examples of protein shakes are [employer], [employer], EAS Edge HP, and Unjury. See handout from pre-op Bariatric Education Class:  Slowly increase the amount of protein shake you drink as tolerated  You may find it easier to slowly sip shakes throughout the day  It is important to get your proteins in first  Your fluid goal is to drink 64 - 100 ounces of fluid daily  It may take a few weeks to build up to this  32 oz (or more) should be clear liquids  And  32 oz (or more) should be full liquids (see below for examples)  Liquids should not contain sugar, caffeine, or carbonation   Clear Liquids:  Water or Sugar-free flavored water (i.e. Fruit H2O, Propel)  Decaffeinated coffee or tea (sugar-free)  Crystal Lite, Wyler's Lite, Minute Maid Lite  Sugar-free Jell-O  Bouillon or broth  Sugar-free Popsicle: *Less than 20 calories each; Limit 1 per day   Full Liquids:  Protein Shakes/Drinks + 2 choices per day of other full liquids  Full liquids must be:  No More Than 12 grams of Carbs per serving  No More Than 3 grams of Fat per serving  Strained low-fat cream soup  Non-Fat milk  Fat-free Lactaid Milk  Sugar-free yogurt (Dannon Lite & Fit, Kellogg yogurt)    [place], [place], [place]

## 2014-11-27 NOTE — Progress Notes (Signed)
Patient alert and oriented, Post op day 3.  Provided support and encouragement.  Encouraged pulmonary toilet, ambulation and small sips of liquids.  Patient asked appropriate questions about diet and looks better today. All questions answered.  Will continue to monitor.

## 2014-11-28 LAB — CBC
HCT: 36.3 % (ref 36.0–46.0)
Hemoglobin: 11.5 g/dL — ABNORMAL LOW (ref 12.0–15.0)
MCH: 29.6 pg (ref 26.0–34.0)
MCHC: 31.7 g/dL (ref 30.0–36.0)
MCV: 93.6 fL (ref 78.0–100.0)
PLATELETS: 161 10*3/uL (ref 150–400)
RBC: 3.88 MIL/uL (ref 3.87–5.11)
RDW: 13.8 % (ref 11.5–15.5)
WBC: 9.8 10*3/uL (ref 4.0–10.5)

## 2014-11-28 NOTE — Progress Notes (Signed)
Patient ID: Kristen Delacruz, female   DOB: 10/14/1965, 49 y.o.   MRN: [id number] 3 Days Post-Op  Subjective: Nausea has improved. She has been using Zofran regularly to try to prevent nausea. Has been taking some liquids but not a large amount. Still using IV pain medicine for epigastric pain but this is not as bad. She is passing flatus.  Objective: Vital signs in last 24 hours: Temp:  [98 F (36.7 C)-99.3 F (37.4 C)] 98.9 F (37.2 C) (05/05 0522) Pulse Rate:  [62-71] 62 (05/05 0522) Resp:  [16-18] 16 (05/05 0522) BP: (117-149)/(59-73) 125/72 mmHg (05/05 0522) SpO2:  [92 %-97 %] 97 % (05/05 0522) Last BM Date: 11/25/14  Intake/Output from previous day: 05/04 0701 - 05/05 0700 In: 3220 [P.O.:220; I.V.:3000] Out: 1075 [Urine:1075] Intake/Output this shift:    General appearance: alert, cooperative, no distress and appears much more comfortable GI: mild epigastric tenderness improved from yesterday Incision/Wound: clean and dry without evidence of infection  Lab Results:   Recent Labs  11/27/14 0611 11/28/14 0410  WBC 10.4 9.8  HGB 11.6* 11.5*  HCT 36.8 36.3  PLT 171 161   BMET No results for input(s): NA, K, CL, CO2, GLUCOSE, BUN, CREATININE, CALCIUM in the last 72 hours.   Studies/Results: Dg Ugi W/water Sol Cm  11/26/2014   CLINICAL DATA:  49 year old female status post Roux-en-Y gastric bypass.  EXAM: WATER SOLUBLE UPPER GI SERIES  TECHNIQUE: Single-column upper GI series was performed using water soluble contrast.  CONTRAST:  27mL OMNIPAQUE IOHEXOL 300 MG/ML  SOLN  COMPARISON:  No priors.  FLUOROSCOPY TIME:  If the device does not provide the exposure index:  Fluoroscopy Time (in minutes and seconds):  1 minutes and 6 seconds  Number of Acquired Images:  25  FINDINGS: Preprocedure KUB demonstrated some surgical clips in the right upper quadrant of the abdomen. Bowel gas pattern was nonobstructive.  Subsequently, following ingestion of Omnipaque 300, the small gastric  remnant was identified, with rapid transit of contrast into the small bowel. No leakage of contrast was noted at any time during the examination, and no evidence of obstruction. Extensive gastroesophageal reflux was observed throughout the examination.  IMPRESSION: 1. Expected postoperative appearance in patient status post Roux-en-Y gastric bypass, without complicating features, as above.   Electronically Signed   By: Vinnie Langton M.D.   On: 11/26/2014 09:40    Anti-infectives: Anti-infectives    Start     Dose/Rate Route Frequency Ordered Stop   11/25/14 0518  cefOXitin (MEFOXIN) 2 g in dextrose 5 % 50 mL IVPB     2 g 100 mL/hr over 30 Minutes Intravenous On call to O.R. 11/25/14 2035 11/25/14 0915      Assessment/Plan: s/p Procedure(s): LAPAROSCOPIC ROUX-EN-Y GASTRIC BYPASS Postoperative nausea and vomiting. She has had this before and I think more related to anesthesia than the specific surgery. I do not see evidence of complication. Not taking enough by mouth yet for discharge. I encouraged her to push the liquids a little more today, ambulate and try to switch over to oral pain medications. Possibly home tomorrow morning.  LOS: 3 days    Jennetta Flood T 11/28/2014

## 2014-11-28 NOTE — Progress Notes (Signed)
Patient alert and oriented, Post op day 3.  Provided support and encouragement.  Encouraged pulmonary toilet, ambulation and small sips of liquids.  Patient is not motivated to ambulate, drink or stop IV medications.  Re-educated patient on benefits of PO medications vs. IV medications, patient acknowledged understanding.  All questions answered.  Will continue to monitor.

## 2014-11-29 MED ORDER — ONDANSETRON HCL 4 MG PO TABS: 4.0000 mg | ORAL_TABLET | Freq: Three times a day (TID) | ORAL | Status: DC | PRN

## 2014-11-29 NOTE — Discharge Summary (Signed)
   Patient ID: Kristen Delacruz [id number] 49 y.o. 1965-09-21  11/25/2014  Discharge date and time: 11/29/2014   Admitting Physician: Excell Seltzer T  Discharge Physician: Excell Seltzer T  Admission Diagnoses: morbid obesity  Discharge Diagnoses: same  Operations: Procedure(s): LAPAROSCOPIC ROUX-EN-Y GASTRIC BYPASS  Admission Condition: good  Discharged Condition: good  Indication for Admission: patient has a long history of progressive morbid obesity unresponsive to medical management. Following an extensive evaluation and discussion detailed elsewhere she has admitted for elective laparoscopic Roux-en-Y gastric bypass for treatment of her morbid obesity.  Hospital Course: on the day of admission the patient underwent an uneventful laparoscopic Roux-en-Y gastric bypass. The following day she had quite severe nausea and moderate pain. She has a history of postoperative nausea and vomiting. Gastrografin swallow showed no leak or obstruction. She was started on ice chips and sips of water but really was unable to tolerate any thing by mouth on the first postoperative day. She had gradual improvement from this point on. Lab work and vital signs were unremarkable. On the second postoperative day she was able to start a few ice chips although still was quite nauseated. On the third postoperative day nausea was improving and she was tolerating sips of clear liquids and water. On the day of discharge she is tolerating her protein shakes well and her nausea seems resolved. Pain is much improved. She is ambulatory. She feels she is doing well and ready for discharge.   Disposition: Home  Patient Instructions:    Medication List    TAKE these medications        acetaminophen 500 MG tablet  Commonly known as:  TYLENOL  Take 500 mg by mouth every 6 (six) hours as needed.     albuterol 108 (90 BASE) MCG/ACT inhaler  Commonly known as:  PROVENTIL HFA;VENTOLIN HFA  Inhale 1-2 puffs into  the lungs every 4 (four) hours as needed for wheezing or shortness of breath.     CALCIUM CITRATE + D PO  Take 1 each by mouth every morning. Chewable.     FLINSTONES GUMMIES OMEGA-3 DHA Chew  Chew 1 each by mouth every morning.     furosemide 40 MG tablet  Commonly known as:  LASIX  Take 40 mg by mouth daily. If needed for lower extremity swelling     ibuprofen 800 MG tablet  Commonly known as:  ADVIL,MOTRIN  Take 800 mg by mouth 2 (two) times daily as needed for moderate pain (back pain.).     omeprazole 40 MG capsule  Commonly known as:  PRILOSEC  Take 40 mg by mouth 2 (two) times daily.     ondansetron 4 MG tablet  Commonly known as:  ZOFRAN  Take 1 tablet (4 mg total) by mouth every 8 (eight) hours as needed for nausea or vomiting.     PATADAY OP  Apply 1 drop to eye daily as needed (allergies).     VITAMIN B 12 PO  Take 1 tablet by mouth every morning.        Activity: activity as tolerated Diet: bariatric protein shakes Wound Care: none needed  Follow-up:  With Dr. Excell Seltzer in 3 weeks.  Signed: Edward Jolly MD, FACS  11/29/2014, 8:39 AM

## 2014-11-29 NOTE — Progress Notes (Signed)
Patient verbalizes understanding of when to call MD, appointments and size of medication she can swallow whole. Am assessment unchanged. Discharged via wheelchair to private vehicle.

## 2014-11-29 NOTE — Progress Notes (Signed)
Patient ID: Kristen Delacruz, female   DOB: 1966-05-12, 49 y.o.   MRN: [id number] 4 Days Post-Op  Subjective: Feels much better today. Tolerating protein shakes without nausea. Has had a bowel movement. Pain is improved.  Objective: Vital signs in last 24 hours: Temp:  [98 F (36.7 C)-98.6 F (37 C)] 98.3 F (36.8 C) (05/06 0637) Pulse Rate:  [64-70] 66 (05/06 0637) Resp:  [16-18] 18 (05/06 0637) BP: (99-141)/(67-84) 103/68 mmHg (05/06 0637) SpO2:  [96 %-99 %] 99 % (05/06 0637) Last BM Date: 11/25/14  Intake/Output from previous day: 05/05 0701 - 05/06 0700 In: 4140 [P.O.:1140; I.V.:3000] Out: 1725 [Urine:1725] Intake/Output this shift: Total I/O In: -  Out: 125 [Urine:125]  General appearance: alert, cooperative and no distress GI: normal findings: soft, non-tender Incision/Wound: minimal epigastric tenderness  Lab Results:   Recent Labs  11/27/14 0611 11/28/14 0410  WBC 10.4 9.8  HGB 11.6* 11.5*  HCT 36.8 36.3  PLT 171 161   BMET No results for input(s): NA, K, CL, CO2, GLUCOSE, BUN, CREATININE, CALCIUM in the last 72 hours.   Studies/Results: No results found.  Anti-infectives: Anti-infectives    Start     Dose/Rate Route Frequency Ordered Stop   11/25/14 0518  cefOXitin (MEFOXIN) 2 g in dextrose 5 % 50 mL IVPB     2 g 100 mL/hr over 30 Minutes Intravenous On call to O.R. 11/25/14 7078 11/25/14 0915      Assessment/Plan: s/p Procedure(s): LAPAROSCOPIC ROUX-EN-Y GASTRIC BYPASS Doing well today and ready for discharge.   LOS: 4 days    Magin Balbi T 11/29/2014

## 2014-11-29 NOTE — Discharge Instructions (Signed)

## 2014-11-29 NOTE — Progress Notes (Signed)
Patient alert and oriented, pain is controlled. Patient is tolerating fluids, advance to protein shake Wednesday and patient has tolerated well. Reviewed Gastric Bypass discharge instructions with patient and patient is able to articulate understanding. Provided information on BELT program, Support Group and [facility] outpatient pharmacy. All questions answered, will continue to monitor.

## 2014-12-02 ENCOUNTER — Telehealth (HOSPITAL_COMMUNITY): Payer: Self-pay

## 2014-12-02 NOTE — Telephone Encounter (Signed)
Made discharge phone call to patient per DROP protocol. Asking the following questions.    1. Do you have someone to care for you now that you are home?  yes 2. Are you having pain now that is not relieved by your pain medication?  no 3. Are you able to drink the recommended daily amount of fluids (48 ounces minimum/day) and protein (60-80 grams/day) as prescribed by the dietitian or nutritional counselor?  Yes, getting both in -- I drink all day 4. Are you taking the vitamins and minerals as prescribed?  yes 5. Do you have the "on call" number to contact your surgeon if you have a problem or question?  yes 6. Are your incisions free of redness, swelling or drainage? (If steri strips, address that these can fall off, shower as tolerated) yes 7. Have your bowels moved since your surgery?  If not, are you passing gas?  yes/yes 8. Are you up and walking 3-4 times per day?   yes    1. Do you have an appointment made to see your surgeon in the next month?  yes 2. Were you provided your discharge medications before your surgery or before you were discharged from the hospital and are you taking them without problem?  yes 3. Were you provided phone numbers to the clinic/surgeon's office?  yes 4. Did you watch the patient education video module in the (clinic, surgeon's office, etc.) before your surgery? yes 5. Do you have a discharge checklist that was provided to you in the hospital to reference with instructions on how to take care of yourself after surgery?  yes 6. Did you see a dietitian or nutritional counselor while you were in the hospital?  yes 7. Do you have an appointment to see a dietitian or nutritional counselor in the next month? yes

## 2014-12-10 ENCOUNTER — Encounter: Payer: 59 | Attending: General Surgery

## 2014-12-10 DIAGNOSIS — Z6841 Body Mass Index (BMI) 40.0 and over, adult: Secondary | ICD-10-CM | POA: Diagnosis not present

## 2014-12-10 DIAGNOSIS — Z713 Dietary counseling and surveillance: Secondary | ICD-10-CM | POA: Insufficient documentation

## 2014-12-10 NOTE — Progress Notes (Signed)
Bariatric Class:  Appt start time: 1530 end time:  1630.  2 Week Post-Operative Nutrition Class  Patient was seen on 12/10/2014 for Post-Operative Nutrition education at [facility].   Surgery date: 11/25/14 Surgery type: RYGB Start weight at [facility]: 256 lbs on 09/11/14 Weight today: 241 lbs  Weight change: 18 lbs  TANITA  BODY COMP RESULTS  10/28/14 12/10/14   BMI (kg/m^2) 41.8 38.9   Fat Mass (lbs) 137.5 123.5   Fat Free Mass (lbs) 121.5 117.5   Total Body Water (lbs) 89 86.0     The following the learning objectives were met by the patient during this course:  Identifies Phase 3A (Soft, High Proteins) Dietary Goals and will begin from 2 weeks post-operatively to 2 months post-operatively  Identifies appropriate sources of fluids and proteins   States protein recommendations and appropriate sources post-operatively  Identifies the need for appropriate texture modifications, mastication, and bite sizes when consuming solids  Identifies appropriate multivitamin and calcium sources post-operatively  Describes the need for physical activity post-operatively and will follow MD recommendations  States when to call healthcare provider regarding medication questions or post-operative complications  Handouts given during class include:  Phase 3A: Soft, High Protein Diet Handout  Follow-Up Plan: Patient will follow-up at [facility] in 6 weeks for 2 month post-op nutrition visit for diet advancement per MD.

## 2015-01-10 ENCOUNTER — Other Ambulatory Visit: Payer: Self-pay

## 2015-01-10 DIAGNOSIS — R1310 Dysphagia, unspecified: Secondary | ICD-10-CM

## 2015-01-13 ENCOUNTER — Ambulatory Visit
Admission: RE | Admit: 2015-01-13 | Discharge: 2015-01-13 | Disposition: A | Discharge status: Home / Self Care | Payer: 59 | Source: Ambulatory Visit | Attending: General Surgery | Admitting: General Surgery

## 2015-01-20 ENCOUNTER — Encounter: Payer: 59 | Attending: General Surgery | Admitting: Dietician

## 2015-01-20 ENCOUNTER — Encounter: Payer: Self-pay | Admitting: Dietician

## 2015-01-20 DIAGNOSIS — Z6841 Body Mass Index (BMI) 40.0 and over, adult: Secondary | ICD-10-CM | POA: Diagnosis not present

## 2015-01-20 DIAGNOSIS — Z713 Dietary counseling and surveillance: Secondary | ICD-10-CM | POA: Diagnosis not present

## 2015-01-20 NOTE — Patient Instructions (Addendum)
Goals:  Follow Phase 3B: High Protein + Non-Starchy Vegetables  Eat 3-6 small meals/snacks, every 3-5 hrs  Increase lean protein foods to meet 60g goal  In order to meet protein goal, having 1 unjury chicken soup at lunch (21 g), 1 yogurt (9 g), and 1 premier shake each day (30 g).  Keep trying beans, chili, fish, and small bites of other protein when you can.   Avoid noodles, sugar, potatoes, and other starches for now.   Increase fluid intake to 64oz +  Avoid drinking 15 minutes before, during and 30 minutes after eating  Aim for >30 min of physical activity daily  Talk to nurse at [facility] about constipation and being cleared for exercise.  Surgery date: 11/25/14 Surgery type: RYGB Start weight at [facility]: 256 lbs on 09/11/14 Weight today: 217.5 lbs  Weight change: 23.5 lbs Total weight loss: 38.5  TANITA  BODY COMP RESULTS  10/28/14 12/10/14 01/20/15   BMI (kg/m^2) 41.8 38.9 35.1   Fat Mass (lbs) 137.5 123.5 108.0   Fat Free Mass (lbs) 121.5 117.5 109.5   Total Body Water (lbs) 89 86.0 80.0

## 2015-01-20 NOTE — Progress Notes (Signed)
  Follow-up visit:  8 Weeks Post-Operative RYGB Surgery  Medical Nutrition Therapy:  Appt start time: 1100 end time:  1130.  Primary concerns today: Post-operative Bariatric Surgery Nutrition Management. 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 Dr. Excell Seltzer can't do anything about this yet. Will follow up with him in 2 weeks.   Having constipation regularly.   Having some slushies, noodle soup, quesadillas, potatoes, chicken pie, chili in small amounts.   Surgery date: 11/25/14 Surgery type: RYGB Start weight at [facility]: 256 lbs on 09/11/14 Weight today: 217.5 lbs  Weight change: 23.5 lbs Total weight loss: 38.5  TANITA  BODY COMP RESULTS  10/28/14 12/10/14 01/20/15   BMI (kg/m^2) 41.8 38.9 35.1   Fat Mass (lbs) 137.5 123.5 108.0   Fat Free Mass (lbs) 121.5 117.5 109.5   Total Body Water (lbs) 89 86.0 80.0    Preferred Learning Style:   No preference indicated   Learning Readiness:   Ready  24-hr recall: B (AM): 1/2 Premier Protein (15 g) Snk (AM): none  L (PM): chicken noodle soup or vegetable broth or chicken quesadilla  Snk (PM): SF popsicle (1-2 day) D (PM): 1/2 Premier Protein (15 g) Snk (PM): might "nibble" some food  Fluid intake: 32 oz water, 10-15 oz Powerade Zero, 11 ok protein shake, popsicles (close to 60 oz) Estimated total protein intake: ~30 g  Medications: see list  Supplementation: taking  Using straws: No Drinking while eating: No Hair loss: Yes Carbonated beverages: No N/V/D/C: vomiting when food is stuck, having some nausea, having constipation regularly and taking dulcalax Dumping syndrome: No  Recent physical activity:  Walking 3 x week for 45 minutes  Progress Towards Goal(s):  In progress.  Handouts given during visit include:  Phase 3B High Protein + Non starchy vegetables   Nutritional Diagnosis:  [place]-3.3 Overweight/obesity related to past poor dietary habits and physical inactivity as evidenced by patient w/ recent  RYGB surgery following dietary guidelines for continued weight loss.    Intervention:  Nutrition education/diet advancement Goals:  Follow Phase 3B: High Protein + Non-Starchy Vegetables  Eat 3-6 small meals/snacks, every 3-5 hrs  Increase lean protein foods to meet 60g goal  In order to meet protein goal, having 1 unjury chicken soup at lunch (21 g), 1 yogurt (9 g), and 1 premier shake each day (30 g).  Keep trying beans, chili, fish, and small bites of other protein when you can.   Avoid noodles, sugar, potatoes, and other starches for now.   Increase fluid intake to 64oz +  Avoid drinking 15 minutes before, during and 30 minutes after eating  Aim for >30 min of physical activity daily  Talk to nurse at [place] about constipation and being cleared for exercise.  Teaching Method Utilized:  Visual Auditory Hands on  Barriers to learning/adherence to lifestyle change: narrowing of esophagus  Demonstrated degree of understanding via:  Teach Back   Monitoring/Evaluation:  Dietary intake, exercise, lap band fills, and body weight. Follow up in 1 months for 3 month post-op visit.

## 2015-02-20 ENCOUNTER — Encounter: Payer: 59 | Attending: General Surgery | Admitting: Dietician

## 2015-02-20 ENCOUNTER — Encounter: Payer: Self-pay | Admitting: Dietician

## 2015-02-20 DIAGNOSIS — Z713 Dietary counseling and surveillance: Secondary | ICD-10-CM | POA: Diagnosis not present

## 2015-02-20 DIAGNOSIS — Z6841 Body Mass Index (BMI) 40.0 and over, adult: Secondary | ICD-10-CM | POA: Insufficient documentation

## 2015-02-20 NOTE — Patient Instructions (Addendum)
Goals:  Follow Phase 3B: High Protein + Non-Starchy Vegetables  Eat 3-6 small meals/snacks, every 3-5 hrs  Increase lean protein foods to meet 60g goal  Keep trying beans, chili, and small bites of other protein when you can   Avoid noodles, sugar, potatoes, and other starches for now  Have beans/chili for lunch and yogurt in the afternoon   Increase fluid intake to 64oz +  Avoid drinking 15 minutes before, during and 30 minutes after eating  Aim for >30 min of physical activity daily  Surgery date: 11/25/14 Surgery type: RYGB Start weight at [facility]: 256 lbs on 09/11/14 Weight today: 203.5 lbs  Weight change: 14 lbs Total weight loss: 52 lbs  TANITA  BODY COMP RESULTS  10/28/14 12/10/14 01/20/15 02/20/15   BMI (kg/m^2) 41.8 38.9 35.1 32.8   Fat Mass (lbs) 137.5 123.5 108.0 96.5   Fat Free Mass (lbs) 121.5 117.5 109.5 107.0   Total Body Water (lbs) 89 86.0 80.0 78.5

## 2015-02-20 NOTE — Progress Notes (Signed)
  Follow-up visit:  3 Months Post-Operative RYGB Surgery  Medical Nutrition Therapy:  Appt start time: 122 end time: 1015    Primary concerns today: Post-operative Bariatric Surgery Nutrition Management. 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 Still not having solid foods for the most part. Will be seeing Dr. Excell Seltzer again on 8/19.   Having constipation regularly.   Having some slushies, noodle soup, potatoes, and chicken pie (doesn't eat crust).  Surgery date: 11/25/14 Surgery type: RYGB Start weight at [facility]: 256 lbs on 09/11/14 Weight today: 203.5 lbs  Weight change: 14 lbs Total weight loss: 52 lbs  TANITA  BODY COMP RESULTS  10/28/14 12/10/14 01/20/15 02/20/15   BMI (kg/m^2) 41.8 38.9 35.1 32.8   Fat Mass (lbs) 137.5 123.5 108.0 96.5   Fat Free Mass (lbs) 121.5 117.5 109.5 107.0   Total Body Water (lbs) 89 86.0 80.0 78.5    Preferred Learning Style:   No preference indicated   Learning Readiness:   Ready  24-hr recall: B (AM): 3/4 Premier Protein (22 g) Snk (AM): SF jello or dannon light and fit (12 g) L (PM): soup with beans (5 g)  Snk (PM): none D (PM): protein shake from gym (35 g) Snk (PM): none  Fluid intake: 64 oz water, 20 oz protein shake (84 oz) Estimated total protein intake: 74 g g  Medications: see list  Supplementation: taking except for calcium since she can't swallow them   Using straws: sometimes (not in water) Drinking while eating: drinking less than 30 minutes after meals Hair loss: Yes Carbonated beverages: No N/V/D/C: vomiting when food is stuck, having some nausea, having constipation regularly and taking dulcalax Dumping syndrome: No  Recent physical activity:  Walking on treadmill or bicycle 3 x week for 45-60 minutes  Progress Towards Goal(s):  In progress.  Handouts given during visit include:  Phase 3B High Protein + Non starchy vegetables   Nutritional Diagnosis:  [place]-3.3 Overweight/obesity related to past  poor dietary habits and physical inactivity as evidenced by patient w/ recent RYGB surgery following dietary guidelines for continued weight loss.    Intervention:  Nutrition education/diet advancement Goals:  Follow Phase 3B: High Protein + Non-Starchy Vegetables  Eat 3-6 small meals/snacks, every 3-5 hrs  Increase lean protein foods to meet 60g goal  In order to meet protein goal, having 1 unjury chicken soup at lunch (21 g), 1 yogurt (9 g), and 1 premier shake each day (30 g).  Keep trying beans, chili, fish, and small bites of other protein when you can.   Avoid noodles, sugar, potatoes, and other starches for now.   Increase fluid intake to 64oz +  Avoid drinking 15 minutes before, during and 30 minutes after eating  Aim for >30 min of physical activity daily  Talk to nurse at [place] about constipation and being cleared for exercise.  Teaching Method Utilized:  Visual Auditory Hands on  Barriers to learning/adherence to lifestyle change: narrowing of esophagus  Demonstrated degree of understanding via:  Teach Back   Monitoring/Evaluation:  Dietary intake, exercise, and body weight. Follow up in 2 months for 5 month post-op visit.

## 2015-03-20 ENCOUNTER — Other Ambulatory Visit: Payer: Self-pay | Admitting: Surgery

## 2015-03-24 NOTE — H&P (Signed)
Kristen Delacruz 03/14/2015 12:09 PM Location: [facility] Patient #: [id number] DOB: [birth year] Married / Language: English / Race: White Female  History of Present Illness   The patient is a 49 year old female presenting status-post bariatric surgery. 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 Now just over 3 months postoperative.   She had no postoperative complications and on her first visit at 3 weeks she was doing very well. Following this she developed some progressive dysphagia and occasional regurgitation and epigastric discomfort with solid foods. We obtained a barium swallow which showed some narrowing at her gastrojejunostomy with free flow of liquid but the barium tablet did not pass. We have backed off to a mostly liquid diet. She is having continued symptoms but only with solid food. Tolerating liquids well. She gets pain and fullness and nausea almost every time she tries solid food. She's getting her protein in well with protein shakes. Also concerned about a lot of hair loss.   Allergies Elbert Ewings, CMA; 03/14/2015 12:09 PM) Advair Diskus *ANTIASTHMATIC AND BRONCHODILATOR AGENTS* Rash, Swelling.  Medication History Elbert Ewings, [place]; 03/14/2015 12:09 PM) Zofran ODT (4MG  Tablet Disperse, 1 (one) Tablet Disperse Oral every four hours, as needed, Taken starting 02/26/2015) Active. Protonix (20MG  Tablet DR, 1 (one) Tablet DR Oral daily, Taken starting 01/31/2015) Active. OxyCODONE HCl (5MG /5ML Solution, 5-10 Oral every four hours, as needed, Taken starting 11/07/2014) Active. Furosemide (40MG  Tablet, Oral daily) Active. Ondansetron HCl (4MG  Tablet, Oral as needed) Active. Tylenol Extra Strength (500MG  Tablet, Oral as needed) Active. Calcium-Phosphorus-Vitamin D (140-25-100MG -MG-UNIT Wafer, Oral daily) Active. Vitamin B12 (100MCG Tablet, Oral daily) Active. Pataday (0.2% Solution, Ophthalmic as  needed) Active. Multi Vitamin Daily (Oral daily) Active. Phentermine HCl (15MG  Capsule, Oral daily) Active. Xopenex Concentrate (1.25MG /0.5ML Nebulized Soln, Inhalation as needed) Active. Medications Reconciled  Vitals Elbert Ewings CMA; 03/14/2015 12:09 PM) 03/14/2015 12:09 PM  Weight: 197.6 lb Height: 65in  Body Surface Area: 2.03 m Body Mass Index: 32.88 kg/m Temp.: 97.52F(Oral)  Pulse: 73 (Regular)  BP: 130/68 (Sitting, Left Arm, Standard)  Physical Exam Marland Kitchen T. Hoxworth MD; 03/14/2015 12:26 PM) General Note: In no distress, appears well  Abdomen Note: Soft and nontender. Incisions fine and no hernias  Assessment & Plan  1.   GASTRIC BYPASS STATUS FOR OBESITY (V45.86  Z98.84)  Impression: Some narrowing at her gastrojejunostomy on previous imaging which I was hopeful would resolve over time secondary to edema but her symptoms are persistent at 3 months. I think an endoscopy would be helpful to evaluate and consider dilatation if she has a tight anastomosis.   Continue Protonix for now. She is getting her nutrition in without difficulty with protein shakes. We discussed her hair loss which is of course expected to some degree after gastric bypass but is somewhat more excessive in her case and after discussion we will go ahead and get a dermatology evaluation to make sure there is not some other etiology we are missing. I will call her following the results of the endoscopy.   Current Plans  Follow up in 3 months or as needed  Pt Education - [employer] Free Text Education/Instructions: discussed with patient and provided information.  2.  Sleep apnea 3.  History of migraine headaches 4.  Back pain  Alphonsa Overall, MD, [facility] Pager: [phone] Office phone:  [phone]

## 2015-03-25 ENCOUNTER — Encounter (HOSPITAL_COMMUNITY): Admission: RE | Disposition: A | Discharge status: Home / Self Care | Payer: Self-pay | Attending: Surgery

## 2015-03-25 ENCOUNTER — Ambulatory Visit (HOSPITAL_COMMUNITY)
Admission: RE | Admit: 2015-03-25 | Discharge: 2015-03-25 | Disposition: A | Discharge status: Home / Self Care | Payer: 59 | Source: Intra-hospital | Attending: Surgery | Admitting: Surgery

## 2015-03-25 ENCOUNTER — Encounter (HOSPITAL_COMMUNITY): Payer: Self-pay

## 2015-03-25 DIAGNOSIS — R1310 Dysphagia, unspecified: Secondary | ICD-10-CM | POA: Diagnosis present

## 2015-03-25 DIAGNOSIS — R110 Nausea: Secondary | ICD-10-CM | POA: Diagnosis not present

## 2015-03-25 DIAGNOSIS — G4730 Sleep apnea, unspecified: Secondary | ICD-10-CM | POA: Diagnosis not present

## 2015-03-25 DIAGNOSIS — M549 Dorsalgia, unspecified: Secondary | ICD-10-CM | POA: Diagnosis not present

## 2015-03-25 DIAGNOSIS — Z9884 Bariatric surgery status: Secondary | ICD-10-CM | POA: Diagnosis not present

## 2015-03-25 DIAGNOSIS — Z79899 Other long term (current) drug therapy: Secondary | ICD-10-CM | POA: Insufficient documentation

## 2015-03-25 DIAGNOSIS — K289 Gastrojejunal ulcer, unspecified as acute or chronic, without hemorrhage or perforation: Secondary | ICD-10-CM | POA: Insufficient documentation

## 2015-03-25 HISTORY — PX: BALLOON DILATION: SHX5330

## 2015-03-25 HISTORY — PX: ESOPHAGOGASTRODUODENOSCOPY: SHX5428

## 2015-03-25 SURGERY — EGD (ESOPHAGOGASTRODUODENOSCOPY)
Anesthesia: Moderate Sedation

## 2015-03-25 MED ORDER — MIDAZOLAM HCL 10 MG/2ML IJ SOLN
INTRAMUSCULAR | Status: DC | PRN
  Administered 2015-03-25: 2 mg via INTRAVENOUS
  Administered 2015-03-25: 1 mg via INTRAVENOUS
  Administered 2015-03-25: 2 mg via INTRAVENOUS

## 2015-03-25 MED ORDER — PANTOPRAZOLE SODIUM 40 MG PO TBEC: 40.0000 mg | DELAYED_RELEASE_TABLET | Freq: Two times a day (BID) | ORAL | Status: AC

## 2015-03-25 MED ORDER — MIDAZOLAM HCL 5 MG/ML IJ SOLN
INTRAMUSCULAR | Status: AC
  Filled 2015-03-25: qty 2

## 2015-03-25 MED ORDER — BUTAMBEN-TETRACAINE-BENZOCAINE 2-2-14 % EX AERO
INHALATION_SPRAY | CUTANEOUS | Status: DC | PRN
  Administered 2015-03-25: 3 via TOPICAL

## 2015-03-25 MED ORDER — FENTANYL CITRATE (PF) 100 MCG/2ML IJ SOLN
INTRAMUSCULAR | Status: DC | PRN
  Administered 2015-03-25 (×3): 25 ug via INTRAVENOUS

## 2015-03-25 MED ORDER — SODIUM CHLORIDE 0.9 % IV SOLN: INTRAVENOUS | Status: DC

## 2015-03-25 MED ORDER — FENTANYL CITRATE (PF) 100 MCG/2ML IJ SOLN
INTRAMUSCULAR | Status: AC
  Filled 2015-03-25: qty 2

## 2015-03-25 MED ORDER — SUCRALFATE 1 GM/10ML PO SUSP
1.0000 g | Freq: Three times a day (TID) | ORAL | Status: DC
Start: 2015-03-25 — End: 2016-01-21

## 2015-03-25 NOTE — Op Note (Signed)
03/25/2015  2:12 PM  PATIENT:  Kristen Delacruz, 49 y.o., female, MRN: [id number]  PREOP DIAGNOSIS:  History of RYGB, nausea and difficulty with solid food  POSTOP DIAGNOSIS:   RYGB normal, marginal ulcer  PROCEDURE:  Esophagogastrojejunoscopy  SURGEON:   Alphonsa Overall, M.D.  ANESTHESIA:   Fentanyl  75 mcg   Versed 5 mg  INDICATIONS FOR PROCEDURE:  Kristen Delacruz is a 49 y.o. (DOB: 05-Apr-1966)  white  female whose primary care physician is FIERY, HUBERT L, MD and comes for upper endoscopy to evaluate a RYGB.  The patient has had trouble with nausea and keeping solid food down..  The patient had a RYGB on 11/24/2104 by Dr. Excell Seltzer.  Her initial weight was 260, BMI 43.3, she has lost about 70 pounds.   The indications and risks of the endoscopy were explained to the patient.  The risks include, but are not limited to, perforation, bleeding, or injury to the bowel.  If balloon dilatation is needed, the risk of perforation is higher.  PROCEDURE:  The patient was in room 3 at [facility] endoscopy unit.  The patient was monitored with a pulse oximetry, BP cuff, and EKG.  The patient has nasal O2 flowing during the procedure.   The back of the throat was anesthestized with Ceticaine x 3.  The patient was positioned in the left lateral decubitus position.  The patient was given Fentanyl and Versed.  A flexible Pentax endoscope was passed down the throat without difficulty.   Findings include:   Esophagus:   Normal   GE junction at:  35 cm   Stomach pouch: Normal post RYGB   Gastrojejunal anastomosis:   41 cm.  There is a small marginal ulcer on the jejunal side of the anastomosis.  The anastomotic opening is about 12 mm.   Efferent jejunal limb:  Normal  For 25 cm   Afferent jejunal limb:  Normal   CLO test:  Done.  Results pending  PLAN:   Photos taken and given to patient.    I gave her a prescription for Protonix 40 mg BID and Carafate 1 gm QID.  She'll check with Dr. Excell Seltzer in about 4  weeks.  Alphonsa Overall, MD, [facility] Pager: [phone] Office phone:  [phone]

## 2015-03-25 NOTE — Interval H&P Note (Signed)
History and Physical Interval Note:  03/25/2015 1:30 PM  Kristen Delacruz  has presented today for surgery, with the diagnosis of post op gastric bypass, vomiting  The various methods of treatment have been discussed with the patient and family. She has lost about 75 pounds.  She is on Protonix QD.  Her husband is with her.  After consideration of risks, benefits and other options for treatment, the patient has consented to  Procedure(s):  UPPER ENDOSCOPY WITH POSSIBLE DILITATION (N/A) BALLOON DILATION (N/A) as a surgical intervention .  The patient's history has been reviewed, patient examined, no change in status, stable for surgery.  I have reviewed the patient's chart and labs.  Questions were answered to the patient's satisfaction.     [name]

## 2015-03-25 NOTE — Discharge Instructions (Signed)
Esophagogastroduodenoscopy °Care After °Refer to this sheet in the next few weeks. These instructions provide you with information on caring for yourself after your procedure. Your caregiver may also give you more specific instructions. Your treatment has been planned according to current medical practices, but problems sometimes occur. Call your caregiver if you have any problems or questions after your procedure.  °HOME CARE INSTRUCTIONS °· Do not eat or drink anything until the numbing medicine (local anesthetic) has worn off and your gag reflex has returned. You will know that the local anesthetic has worn off when you can swallow comfortably. °· Do not drive for 12 hours after the procedure or as directed by your caregiver. °· Only take medicines as directed by your caregiver. °SEEK MEDICAL CARE IF:  °· You cannot stop coughing. °· You are not urinating at all or less than usual. °SEEK IMMEDIATE MEDICAL CARE IF: °· You have difficulty swallowing. °· You cannot eat or drink. °· You have worsening throat or chest pain. °· You have dizziness, lightheadedness, or you faint. °· You have nausea or vomiting. °· You have chills. °· You have a fever. °· You have severe abdominal pain. °· You have black, tarry, or bloody stools. °Document Released: 06/28/2012 Document Reviewed: 06/28/2012 °ExitCare® Patient Information ©2015 [employer]. This information is not intended to replace advice given to you by your health care provider. Make sure you discuss any questions you have with your health care provider. ° °

## 2015-03-26 LAB — CLOTEST (H. PYLORI), BIOPSY: Helicobacter screen: NEGATIVE

## 2015-03-28 ENCOUNTER — Encounter (HOSPITAL_COMMUNITY): Payer: Self-pay | Admitting: Surgery

## 2015-04-01 ENCOUNTER — Encounter (HOSPITAL_COMMUNITY): Payer: Self-pay | Admitting: Surgery

## 2015-04-17 ENCOUNTER — Ambulatory Visit: Payer: 59 | Admitting: Dietician

## 2015-07-09 ENCOUNTER — Encounter: Payer: 59 | Attending: General Surgery | Admitting: Dietician

## 2015-07-09 ENCOUNTER — Other Ambulatory Visit: Payer: Self-pay | Admitting: Surgery

## 2015-07-09 ENCOUNTER — Encounter: Payer: Self-pay | Admitting: Dietician

## 2015-07-09 DIAGNOSIS — Z713 Dietary counseling and surveillance: Secondary | ICD-10-CM | POA: Diagnosis present

## 2015-07-09 DIAGNOSIS — Z9884 Bariatric surgery status: Secondary | ICD-10-CM | POA: Insufficient documentation

## 2015-07-09 NOTE — Patient Instructions (Addendum)
Goals:  Follow Phase 3B: High Protein + Non-Starchy Vegetables  Eat 3-6 small meals/snacks, every 3-5 hrs  Increase lean protein foods to meet 60g goal  Start adding protein snacks between meals like [place] yogurt, cheese, eggs, tuna/chicken salad  Always have a protein food with each meal and snack  Always eat protein (meat) first   Increase fluid intake to 64oz +  Avoid drinking 15 minutes before, during and 30 minutes after eating  Aim for >30 min of physical activity daily  [email or web address] (fill out registration page)  TANITA  BODY COMP RESULTS  10/28/14 12/10/14 01/20/15 02/20/15 07/09/15   BMI (kg/m^2) 41.8 38.9 35.1 32.8 26.1   Fat Mass (lbs) 137.5 123.5 108.0 96.5 59   Fat Free Mass (lbs) 121.5 117.5 109.5 107.0 103   Total Body Water (lbs) 89 86.0 80.0 78.5 75.5

## 2015-07-09 NOTE — Progress Notes (Signed)
  Follow-up visit:  7 Months Post-Operative RYGB Surgery  Medical Nutrition Therapy:  Appt start time: [id number] end time:  1150  Primary concerns today: Post-operative Bariatric Surgery Nutrition Management.  Kristen Delacruz returns having lost another 41 lbs since July. Had an endoscopy and found out she has an ulcer. Still has trouble eating certain foods and has another endoscopy scheduled in January. Also has a narrow esophagus. Not tolerating bread and hamburger. Has noticed she does not get out of breath like she used to and also sleeps better. Still having nausea and constipation.   Surgery date: 11/25/14 Surgery type: RYGB Start weight at [facility]: 256 lbs on 09/11/14 Weight today: 162 lbs Weight change: 41.5 lbs Total weight loss: 94 lbs  TANITA  BODY COMP RESULTS  10/28/14 12/10/14 01/20/15 02/20/15 07/09/15   BMI (kg/m^2) 41.8 38.9 35.1 32.8 26.1   Fat Mass (lbs) 137.5 123.5 108.0 96.5 59   Fat Free Mass (lbs) 121.5 117.5 109.5 107.0 103   Total Body Water (lbs) 89 86.0 80.0 78.5 75.5    Preferred Learning Style:   No preference indicated   Learning Readiness:   Ready  24-hr recall: B (AM): Premier Protein (30 g) Snk (AM):  L (PM): salad with about 1 oz grilled chicken OR soup OR beans (7g) Snk (PM): peanut butter crackers or raw vegetables D (PM): chicken pie or grilled meat or chili (7-14g) Snk (PM): none  Fluid intake: 64 oz water, 20 oz protein shake (84 oz) Estimated total protein intake: 44-51 g/day  Medications: see list  Supplementation: taking except for calcium since she can't swallow them   Using straws: sometimes (not in water) Drinking while eating: drinking less than 30 minutes after meals Hair loss: having regrowth Carbonated beverages: No N/V/D/C: nausea, taking zofran; constipation, taking Dulcolax Dumping syndrome: No  Recent physical activity:  Walking on treadmill or bicycle 3 x week for 45-60 minutes  Progress Towards Goal(s):  In progress.  Handouts  given during visit include:  Bariatric snack list   Nutritional Diagnosis:  -3.3 Overweight/obesity related to past poor dietary habits and physical inactivity as evidenced by patient w/ recent RYGB surgery following dietary guidelines for continued weight loss.    Intervention:  Nutrition education/diet advancement Goals:  Follow Phase 3B: High Protein + Non-Starchy Vegetables  Eat 3-6 small meals/snacks, every 3-5 hrs  Increase lean protein foods to meet 60g goal  Start adding protein snacks between meals like [place] yogurt, cheese, eggs, tuna/chicken salad  Always have a protein food with each meal and snack  Always eat protein (meat) first   Increase fluid intake to 64oz +  Avoid drinking 15 minutes before, during and 30 minutes after eating  Aim for >30 min of physical activity daily  [email or web address] (fill out registration page)  Teaching Method Utilized:  Visual Auditory Hands on  Barriers to learning/adherence to lifestyle change: narrowing of esophagus  Demonstrated degree of understanding via:  Teach Back   Monitoring/Evaluation:  Dietary intake, exercise, and body weight. Follow up in 3 months for 10 month post-op visit.

## 2015-07-31 ENCOUNTER — Encounter (HOSPITAL_COMMUNITY): Payer: Self-pay | Admitting: *Deleted

## 2015-07-31 ENCOUNTER — Ambulatory Visit (HOSPITAL_COMMUNITY)
Admission: RE | Admit: 2015-07-31 | Discharge: 2015-07-31 | Disposition: A | Discharge status: Home / Self Care | Payer: BLUE CROSS/BLUE SHIELD | Source: Ambulatory Visit | Attending: Surgery | Admitting: Surgery

## 2015-07-31 ENCOUNTER — Encounter (HOSPITAL_COMMUNITY)
Admission: RE | Disposition: A | Discharge status: Home / Self Care | Payer: Self-pay | Source: Ambulatory Visit | Attending: Surgery

## 2015-07-31 DIAGNOSIS — G4730 Sleep apnea, unspecified: Secondary | ICD-10-CM | POA: Insufficient documentation

## 2015-07-31 DIAGNOSIS — Z9884 Bariatric surgery status: Secondary | ICD-10-CM | POA: Insufficient documentation

## 2015-07-31 DIAGNOSIS — E669 Obesity, unspecified: Secondary | ICD-10-CM | POA: Diagnosis not present

## 2015-07-31 DIAGNOSIS — R1013 Epigastric pain: Secondary | ICD-10-CM | POA: Insufficient documentation

## 2015-07-31 DIAGNOSIS — Z6827 Body mass index (BMI) 27.0-27.9, adult: Secondary | ICD-10-CM | POA: Diagnosis not present

## 2015-07-31 DIAGNOSIS — Z980 Intestinal bypass and anastomosis status: Secondary | ICD-10-CM | POA: Insufficient documentation

## 2015-07-31 HISTORY — PX: ESOPHAGOGASTRODUODENOSCOPY: SHX5428

## 2015-07-31 SURGERY — EGD (ESOPHAGOGASTRODUODENOSCOPY)
Anesthesia: Moderate Sedation

## 2015-07-31 MED ORDER — SODIUM CHLORIDE 0.9 % IV SOLN
INTRAVENOUS | Status: DC
  Administered 2015-07-31 (×2): 500 mL via INTRAVENOUS

## 2015-07-31 MED ORDER — MIDAZOLAM HCL 5 MG/ML IJ SOLN
INTRAMUSCULAR | Status: AC
  Filled 2015-07-31: qty 2

## 2015-07-31 MED ORDER — FENTANYL CITRATE (PF) 100 MCG/2ML IJ SOLN
INTRAMUSCULAR | Status: AC
  Filled 2015-07-31: qty 4

## 2015-07-31 MED ORDER — FENTANYL CITRATE (PF) 100 MCG/2ML IJ SOLN
INTRAMUSCULAR | Status: DC | PRN
  Administered 2015-07-31 (×3): 25 ug via INTRAVENOUS

## 2015-07-31 MED ORDER — LIDOCAINE VISCOUS 2 % MT SOLN
OROMUCOSAL | Status: AC
  Filled 2015-07-31: qty 15

## 2015-07-31 MED ORDER — MIDAZOLAM HCL 10 MG/2ML IJ SOLN
INTRAMUSCULAR | Status: DC | PRN
  Administered 2015-07-31: 2 mg via INTRAVENOUS
  Administered 2015-07-31: 1 mg via INTRAVENOUS
  Administered 2015-07-31: 2 mg via INTRAVENOUS

## 2015-07-31 MED ORDER — DIPHENHYDRAMINE HCL 50 MG/ML IJ SOLN
INTRAMUSCULAR | Status: AC
  Filled 2015-07-31: qty 1

## 2015-07-31 NOTE — H&P (Signed)
Kristen Delacruz  Location: [facility] Patient #: [id number] DOB: [birth year] Married / Language: English / Race: White Female   History of Present Illness   The patient is a 50 year old female presenting status-post bariatric surgery. 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 Now over 7 months postoperative. She had no postoperative complications and on her first visit at 3 weeks she was doing very well. Following this she developed some progressive dysphagia and occasional regurgitation and epigastric discomfort with solid foods. We obtained a barium swallow which showed some narrowing at her gastrojejunostomy with free flow of liquid but the barium tablet did not pass. We have backed off to a mostly liquid diet.   Her symptoms continued and she underwent upper endoscopy on 03/25/2015 by Dr. Lucia Gaskins. He found a small marginal ulcer and a 12 mm anastomosis that he did not feel was tight enough to warrant dilatation at this time.   She is having continued symptoms but only with solid food. Tolerating liquids well. She gets pain and fullness and nausea almost every time she tries solid food. Also occasionally epigastric pain with solid food She's getting her protein in well with protein shakes. She has had a lot of her loss but this actually seems to be stabilizing. She has really not noticed any change in her symptoms over the last couple of months. She is overall very happy with her weight loss and has good energy and otherwise feels very well until she tries to eat. She does not have any abdominal pain unless eating.   Allergies (Sonya Bynum, CMA; 07/03/2015 12:12 PM) Advair Diskus *ANTIASTHMATIC AND BRONCHODILATOR AGENTS* Rash, Swelling.  Medication History Marjean Donna, CMA; 07/03/2015 12:12 PM) Protonix (20MG  Tablet DR, 1 (one) Tablet DR Oral daily, Taken starting 01/31/2015) Active. Zofran ODT (4MG  Tablet Disperse,  1 (one) Tablet Disperse Oral every four hours, as needed, Taken starting 04/22/2015) Active. Ondansetron (4MG  Tablet Disperse, 1 (one) Tablet Oral every six hours, as needed, Taken starting 05/23/2015) Active. Furosemide (40MG  Tablet, Oral daily) Active. Tylenol Extra Strength (500MG  Tablet, Oral as needed) Active. Calcium-Phosphorus-Vitamin D (140-25-100MG -MG-UNIT Wafer, Oral daily) Active. Vitamin B12 (100MCG Tablet, Oral daily) Active. Pataday (0.2% Solution, Ophthalmic as needed) Active. Multi Vitamin Daily (Oral daily) Active. Phentermine HCl (15MG  Capsule, Oral daily) Active. Xopenex Concentrate (1.25MG /0.5ML Nebulized Soln, Inhalation as needed) Active. Medications Reconciled  Vitals (Sonya Bynum CMA; 07/03/2015 12:12 PM) 07/03/2015 12:11 PM Weight: 166.2 lb Height: 65in Body Surface Area: 1.83 m Body Mass Index: 27.66 kg/m  Temp.: 49F(Temporal)  Pulse: 61 (Regular)  BP: 128/80 (Sitting, Left Arm, Standard)    Physical Exam  General: Well-appearing Caucasian female in no distress Abdomen: Soft and without tenderness. Incisions well-healed. No hernias.   Assessment & Plan  1.  GASTRIC BYPASS STATUS FOR OBESITY (Z98.84)  Impression: Excellent weight loss of almost 100 pounds and overall she feels very well but has symptoms of over restriction. She is able to tolerate protein shakes without difficulty. Endoscopy showed small marginal ulcer and slight narrowing of her anastomosis. She remains on Carafate and Protonix.    Due to her persistent symptoms I think we should plan repeat endoscopy as we discussed in about another month and consider dilation. I will discuss this with Dr. Lucia Gaskins.  Current Plans   Follow up with [place] in the office in 3 MONTHS.  2. Sleep apnea 3. History of migraine headaches 4. Back pain  Alphonsa Overall, MD, [facility] Surgery Pager: [phone] Office phone:  [phone]

## 2015-07-31 NOTE — Op Note (Signed)
07/31/2015  11:32 AM  PATIENT:  Kristen Delacruz, 50 y.o., female, MRN: [id number]  PREOP DIAGNOSIS:  History of RYGB, epigastric pain with certain food  POSTOP DIAGNOSIS:   RYGB normal, minimal marginal ulcer (maybe)  PROCEDURE:  Esophagogastrojejunoscopy  SURGEON:   Alphonsa Overall, M.D.  ANESTHESIA:   Fentanyl  75 mcg   Versed 5 mg  INDICATIONS FOR PROCEDURE:  Kristen Delacruz is a 50 y.o. (DOB: 12/24/1965)  white  female whose primary care physician is FIERY, HUBERT L, MD and comes for upper endoscopy to evaluate pain and nausea with eating.  The patient had a RYGB on 10/26/2014  by Dr. Excell Seltzer.  Her initial weight was 260, BMI 43.3.  She said that she has lost over 100 pounds.   The indications and risks of the endoscopy were explained to the patient.  The risks include, but are not limited to, perforation, bleeding, or injury to the bowel.  If balloon dilatation is needed, the risk of perforation is higher.  PROCEDURE:  The patient was in room #3 at [facility] endoscopy unit.  The patient was monitored with a pulse oximetry, BP cuff, and EKG.  The patient has nasal O2 flowing during the procedure.   The back of the throat was anesthestized with Ceticaine x 3.  The patient was positioned in the left lateral decubitus position.  The patient was given Fentanyl and Versed.  A flexible Pentax endoscope was passed down the throat without difficulty.   Findings include:   Esophagus:   Normal    GE junction at:  35 cm   Stomach pouch: Normal post RYGB   Gastrojejunal anastomosis:   41 cm.  There may be a small marginal ulcer - but overall I thought that the anastomosis looks better than on 03/25/2015.  I was easily able to pass the upper endoscope (9 mm diameter) through the anastomosis - I would guess the anastomosis is 12 to 13 cm in diameter at least.   Efferent jejunal limb:  Normal - I went down about 25 cm.   Afferent jejunal limb:  Normal   CLO test:  Done.  Results pending.  PLAN:   Photos  taken and given to patient.    Continue Protonix 40 mg BID and Carafate 1 gm QID. She'll check with Dr. Excell Seltzer in about 4 weeks.  Alphonsa Overall, MD, [facility] Surgery Pager: [phone] Office phone:  [phone]

## 2015-07-31 NOTE — Discharge Instructions (Signed)

## 2015-08-01 ENCOUNTER — Encounter (HOSPITAL_COMMUNITY): Payer: Self-pay | Admitting: Surgery

## 2015-08-01 LAB — CLOTEST (H. PYLORI), BIOPSY: HELICOBACTER SCREEN: NEGATIVE

## 2015-10-08 ENCOUNTER — Encounter: Payer: BLUE CROSS/BLUE SHIELD | Attending: General Surgery | Admitting: Dietician

## 2015-10-08 ENCOUNTER — Encounter: Payer: Self-pay | Admitting: Dietician

## 2015-10-08 DIAGNOSIS — Z713 Dietary counseling and surveillance: Secondary | ICD-10-CM | POA: Diagnosis present

## 2015-10-08 DIAGNOSIS — Z9884 Bariatric surgery status: Secondary | ICD-10-CM | POA: Insufficient documentation

## 2015-10-08 NOTE — Patient Instructions (Addendum)
Goals:  Follow Phase 3B: High Protein + Non-Starchy Vegetables  Eat 3-6 small meals/snacks, every 3-5 hrs  Increase lean protein foods to meet 60g goal  Start adding protein snacks between meals like [place] yogurt, cheese, eggs, tuna/chicken salad, peanut butter  Always have a protein food with each meal and snack  Always eat protein (meat) first   Have a second protein shake at the end of the day if needed  Increase fluid intake to 64oz +  Avoid drinking 15 minutes before, during and 30 minutes after eating  Aim for >30 min of physical activity daily  Surgery date: 11/25/14 Surgery type: RYGB Start weight at [facility] 256 lbs on 09/11/14 Weight today: 142 lbs Weight change: 20 lbs Total weight loss: 114 lbs  TANITA  BODY COMP RESULTS  10/28/14 12/10/14 01/20/15 02/20/15 07/09/15 10/08/15   BMI (kg/m^2) 41.8 38.9 35.1 32.8 26.1 22.9   Fat Mass (lbs) 137.5 123.5 108.0 96.5 59 47   Fat Free Mass (lbs) 121.5 117.5 109.5 107.0 103 95   Total Body Water (lbs) 89 86.0 80.0 78.5 75.5 69.5

## 2015-10-08 NOTE — Progress Notes (Signed)
Follow-up visit:  10 Months Post-Operative RYGB Surgery  Medical Nutrition Therapy:  Appt start time: 945 end time:  1020  Primary concerns today: Post-operative Bariatric Surgery Nutrition Management.  Kristen Delacruz returns having lost another 20 lbs in the last 3 months. She weighs herself at least 3x a week. She states she loses around 3-5 lbs a week. However, she has been the same weight for the past 2 weeks. She is happy with her current weight of 140 lbs. Still drinking protein shakes to meet needs. Struggles to eat broccoli or [place] food, stating they cause "severe stomach pain." Had an endoscopy recently and patient states everything was normal. States she gets nauseated with any foods with sugar. Having significant hair regrowth.   Surgery date: 11/25/14 Surgery type: RYGB Start weight at [facility]: 256 lbs on 09/11/14 Weight today: 142 lbs Weight change: 20 lbs Total weight loss: 114 lbs  TANITA  BODY COMP RESULTS  10/28/14 12/10/14 01/20/15 02/20/15 07/09/15 10/08/15   BMI (kg/m^2) 41.8 38.9 35.1 32.8 26.1 22.9   Fat Mass (lbs) 137.5 123.5 108.0 96.5 59 47   Fat Free Mass (lbs) 121.5 117.5 109.5 107.0 103 95   Total Body Water (lbs) 89 86.0 80.0 78.5 75.5 69.5    Preferred Learning Style:   No preference indicated   Learning Readiness:   Ready  24-hr recall: B (AM): Premier Protein or 1 egg with cheese (10-30 g) Snk (AM): mini bag of Teddy Grahams or Cheezits L (PM): salad and bean soup and part of breadstick OR salad with 1.5 oz chicken (10g) Snk (PM): fruit or Chex Mix D (PM): steak and onions with cheese OR grilled chicken and veggies OR chili beans OR chicken noodle soup OR chicken pie (10g) Snk (PM): peanut butter crackers, second protein shake (5-30g)  Fluid intake: 64 oz water, 20 oz protein shake, Powerade Zero, occasionally apple juice (84 oz) Estimated total protein intake: 50-80g/day  Medications: see list  Supplementation: taking; working on taking Calcium as  prescribed  Using straws: sometimes Drinking while eating: drinking less than 30 minutes after meals Hair loss: having regrowth Carbonated beverages: No N/V/D/C: nausea when she has "anything with sugar"; constipation resolved with 100% apple juice Dumping syndrome: No  Recent physical activity:  Walking on treadmill or bicycle 3 x week for 45-60 minutes  Progress Towards Goal(s):  In progress.  Handouts given during visit include:  Bariatric snack list  Meal planning card   Nutritional Diagnosis:  [place]-3.3 Overweight/obesity related to past poor dietary habits and physical inactivity as evidenced by patient w/ recent RYGB surgery following dietary guidelines for continued weight loss.    Intervention:  Nutrition education/diet advancement Goals:  Follow Phase 3B: High Protein + Non-Starchy Vegetables  Eat 3-6 small meals/snacks, every 3-5 hrs  Increase lean protein foods to meet 60g goal  Start adding protein snacks between meals like [place] yogurt, cheese, eggs, tuna/chicken salad, peanut butter  Always have a protein food with each meal and snack  Always eat protein (meat) first   Have a second protein shake at the end of the day if needed  Increase fluid intake to 64oz +  Avoid drinking 15 minutes before, during and 30 minutes after eating  Aim for >30 min of physical activity daily  Teaching Method Utilized:  Visual Auditory Hands on  Barriers to learning/adherence to lifestyle change: narrowing of esophagus  Demonstrated degree of understanding via:  Teach Back   Monitoring/Evaluation:  Dietary intake, exercise, and body weight. Follow  up in 6 months for 16 month post-op visit.

## 2015-12-29 ENCOUNTER — Other Ambulatory Visit: Payer: Self-pay | Admitting: Surgery

## 2016-01-20 ENCOUNTER — Encounter (HOSPITAL_COMMUNITY): Payer: Self-pay | Admitting: *Deleted

## 2016-02-02 ENCOUNTER — Ambulatory Visit (HOSPITAL_COMMUNITY): Payer: BLUE CROSS/BLUE SHIELD | Admitting: Anesthesiology

## 2016-02-02 ENCOUNTER — Encounter (HOSPITAL_COMMUNITY): Payer: Self-pay | Admitting: *Deleted

## 2016-02-02 ENCOUNTER — Other Ambulatory Visit: Payer: Self-pay | Admitting: Surgery

## 2016-02-02 ENCOUNTER — Encounter (HOSPITAL_COMMUNITY)
Admission: RE | Disposition: A | Discharge status: Home / Self Care | Payer: Self-pay | Source: Ambulatory Visit | Attending: Surgery

## 2016-02-02 ENCOUNTER — Ambulatory Visit (HOSPITAL_COMMUNITY)
Admission: RE | Admit: 2016-02-02 | Discharge: 2016-02-02 | Disposition: A | Discharge status: Home / Self Care | Payer: BLUE CROSS/BLUE SHIELD | Source: Ambulatory Visit | Attending: Surgery | Admitting: Surgery

## 2016-02-02 DIAGNOSIS — Z79899 Other long term (current) drug therapy: Secondary | ICD-10-CM | POA: Insufficient documentation

## 2016-02-02 DIAGNOSIS — R1013 Epigastric pain: Secondary | ICD-10-CM | POA: Insufficient documentation

## 2016-02-02 DIAGNOSIS — R110 Nausea: Secondary | ICD-10-CM | POA: Insufficient documentation

## 2016-02-02 DIAGNOSIS — K219 Gastro-esophageal reflux disease without esophagitis: Secondary | ICD-10-CM | POA: Insufficient documentation

## 2016-02-02 DIAGNOSIS — Z9884 Bariatric surgery status: Secondary | ICD-10-CM | POA: Insufficient documentation

## 2016-02-02 DIAGNOSIS — G4730 Sleep apnea, unspecified: Secondary | ICD-10-CM | POA: Insufficient documentation

## 2016-02-02 DIAGNOSIS — M549 Dorsalgia, unspecified: Secondary | ICD-10-CM | POA: Insufficient documentation

## 2016-02-02 HISTORY — PX: ESOPHAGOGASTRODUODENOSCOPY (EGD) WITH PROPOFOL: SHX5813

## 2016-02-02 HISTORY — PX: BALLOON DILATION: SHX5330

## 2016-02-02 SURGERY — ESOPHAGOGASTRODUODENOSCOPY (EGD) WITH PROPOFOL
Anesthesia: Monitor Anesthesia Care

## 2016-02-02 MED ORDER — FENTANYL CITRATE (PF) 100 MCG/2ML IJ SOLN: 25.0000 ug | INTRAMUSCULAR | Status: DC | PRN

## 2016-02-02 MED ORDER — LACTATED RINGERS IV SOLN
INTRAVENOUS | Status: DC
  Administered 2016-02-02: 12:00:00 via INTRAVENOUS

## 2016-02-02 MED ORDER — ONDANSETRON HCL 4 MG/2ML IJ SOLN
INTRAMUSCULAR | Status: DC | PRN
  Administered 2016-02-02: 4 mg via INTRAVENOUS

## 2016-02-02 MED ORDER — LIDOCAINE HCL (CARDIAC) 20 MG/ML IV SOLN
INTRAVENOUS | Status: DC | PRN
  Administered 2016-02-02: 50 mg via INTRAVENOUS

## 2016-02-02 MED ORDER — PROPOFOL 10 MG/ML IV BOLUS
INTRAVENOUS | Status: AC
  Filled 2016-02-02: qty 40

## 2016-02-02 MED ORDER — FENTANYL CITRATE (PF) 100 MCG/2ML IJ SOLN
25.0000 ug | Freq: Once | INTRAMUSCULAR | Status: AC
  Administered 2016-02-02: 25 ug via INTRAVENOUS

## 2016-02-02 MED ORDER — PROPOFOL 500 MG/50ML IV EMUL
INTRAVENOUS | Status: DC | PRN
Start: 2016-02-02 — End: 2016-02-02
  Administered 2016-02-02: 100 ug/kg/min via INTRAVENOUS

## 2016-02-02 MED ORDER — PROMETHAZINE HCL 25 MG/ML IJ SOLN: 6.2500 mg | INTRAMUSCULAR | Status: DC | PRN

## 2016-02-02 MED ORDER — SODIUM CHLORIDE 0.9 % IV SOLN: INTRAVENOUS | Status: DC

## 2016-02-02 MED ORDER — ONDANSETRON HCL 4 MG/2ML IJ SOLN
INTRAMUSCULAR | Status: AC
  Filled 2016-02-02: qty 2

## 2016-02-02 MED ORDER — FENTANYL CITRATE (PF) 100 MCG/2ML IJ SOLN
INTRAMUSCULAR | Status: AC
  Filled 2016-02-02: qty 2

## 2016-02-02 MED ORDER — LIDOCAINE HCL (CARDIAC) 20 MG/ML IV SOLN
INTRAVENOUS | Status: AC
  Filled 2016-02-02: qty 5

## 2016-02-02 MED ORDER — PROPOFOL 10 MG/ML IV BOLUS
INTRAVENOUS | Status: DC | PRN
  Administered 2016-02-02: 30 mg via INTRAVENOUS

## 2016-02-02 SURGICAL SUPPLY — 15 items

## 2016-02-02 NOTE — Anesthesia Postprocedure Evaluation (Signed)
Anesthesia Post Note  Patient: Kristen Delacruz  Procedure(s) Performed: Procedure(s) (LRB): UPPER ESOPHAGOGASTRODUODENOSCOPY (EGD) WITH PROPOFOL POSSIBLE DILATION   (N/A) BALLOON DILATION (N/A)  Patient location during evaluation: Endoscopy Anesthesia Type: MAC Level of consciousness: awake and alert Pain management: pain level controlled Vital Signs Assessment: post-procedure vital signs reviewed and stable Respiratory status: spontaneous breathing, nonlabored ventilation, respiratory function stable and patient connected to nasal cannula oxygen Cardiovascular status: stable and blood pressure returned to baseline Anesthetic complications: no    Last Vitals:  Filed Vitals:   02/02/16 1405 02/02/16 1410  BP:  107/62  Pulse: 51 50  Temp:    Resp: 13 11    Last Pain:  Filed Vitals:   02/02/16 1412  PainSc: 0-No pain                 Catalina Gravel

## 2016-02-02 NOTE — Anesthesia Preprocedure Evaluation (Addendum)
Anesthesia Evaluation  Patient identified by MRN, date of birth, ID band Patient awake    Reviewed: Allergy & Precautions, NPO status , Patient's Chart, lab work & pertinent test results  History of Anesthesia Complications (+) PONV and history of anesthetic complications (states BP drops with anesthesia)  Airway Mallampati: II  TM Distance: >3 FB Neck ROM: Full    Dental  (+) Teeth Intact, Dental Advisory Given   Pulmonary asthma , sleep apnea ,    Pulmonary exam normal breath sounds clear to auscultation       Cardiovascular negative cardio ROS Normal cardiovascular exam Rhythm:Regular Rate:Normal     Neuro/Psych negative neurological ROS  negative psych ROS   GI/Hepatic Neg liver ROS, hiatal hernia, GERD  Medicated and Controlled,Roux-en-y 11/2014   Endo/Other  negative endocrine ROS  Renal/GU negative Renal ROS     Musculoskeletal negative musculoskeletal ROS (+)   Abdominal   Peds  Hematology negative hematology ROS (+)   Anesthesia Other Findings Day of surgery medications reviewed with the patient.  Reproductive/Obstetrics                          Anesthesia Physical Anesthesia Plan  ASA: II  Anesthesia Plan: MAC   Post-op Pain Management:    Induction: Intravenous  Airway Management Planned: Nasal Cannula  Additional Equipment:   Intra-op Plan:   Post-operative Plan:   Informed Consent: I have reviewed the patients History and Physical, chart, labs and discussed the procedure including the risks, benefits and alternatives for the proposed anesthesia with the patient or authorized representative who has indicated his/her understanding and acceptance.   Dental advisory given  Plan Discussed with: CRNA  Anesthesia Plan Comments: (Discussed risks/benefits/alternatives to MAC sedation including need for ventilatory support, hypotension, need for conversion to general  anesthesia.  All patient questions answered.  Patient/guardian wishes to proceed.)       Anesthesia Quick Evaluation

## 2016-02-02 NOTE — H&P (Signed)
Kristen Delacruz  Location: [facility] Patient #: [id number] DOB: [birth year] Married / Language: English / Race: White Female   History of Present Illness     The patient is a 50 year old female presenting status-post bariatric surgery. 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 Jacinto Reap. [place]. Now one year postoperative. She had no postoperative complications and on her first visit at 3 weeks she was doing very well. Following this she developed some progressive dysphagia and occasional regurgitation and epigastric discomfort with solid foods. We obtained a barium swallow which showed some narrowing at her gastrojejunostomy with free flow of liquid but the barium tablet did not pass.  We then backed off to a mostly liquid diet. Her symptoms continued and she underwent upper endoscopy 5 months postoperatively by Dr. Lucia Gaskins on 03/25/2015. He found a small marginal ulcer and a 12 mm anastomosis that he did not feel was tight enough to warrant dilatation at this time. She continued to have symptoms of postprandial pain and nausea and underwent a repeat endoscopy by Dr. Lucia Gaskins on July 31, 2015. This showed a very questionable small marginal ulcer, and anastomosis estimated to be 13 mm in overall impression that it looked improved from her original endoscopy. Dilatation was not performed. [photos in chart]  Her symptoms are really the same as they were 3 months ago. There are many solid food she is not able to tolerate. She can tolerate some soft protein. Continues with protein shakes. Overall she feels pretty well but has continued to lose weight. Her weight today is 132 pounds which represents a 128 pounds weight loss and current BMI is 21.97. She remains on Protonix. Uses Zofran sometimes once or twice a week or sometimes not at all. Her energy level is very good.  Allergies Patsey Berthold, CMA; 12/26/2015 3:50 PM) Advair  Diskus *ANTIASTHMATIC AND BRONCHODILATOR AGENTS* Rash, Swelling. Fluticasone Propionate *NASAL AGENTS - SYSTEMIC AND TOPICAL*  Medication History Patsey Berthold, CMA; 12/26/2015 3:50 PM) Zofran ODT (4MG  Tablet Disperse, 1 (one) Oral every four hours, as needed, Taken starting 10/24/2015) Active. Xopenex Concentrate (1.25MG /0.5ML Nebulized Soln, Inhalation as needed) Active. Tylenol Extra Strength (500MG  Tablet, Oral as needed) Active. Calcium-Phosphorus-Vitamin D (140-25-100MG -MG-UNIT Wafer, Oral daily) Active. Vitamin B12 (100MCG Tablet, Oral daily) Active. Pataday (0.2% Solution, Ophthalmic as needed) Active. Multi Vitamin Daily (Oral daily) Active. Medications Reconciled  Vitals Patsey Berthold CMA; 12/26/2015 3:50 PM) 12/26/2015 3:50 PM Weight: 132 lb Height: 65in Body Surface Area: 1.66 m Body Mass Index: 21.97 kg/m  Temp.: 97.84F  Pulse: 64 (Regular)  BP: 110/60 (Sitting, Left Arm, Standard)  Physical Exam  General: Thin but well-appearing Caucasian female Skin: No rash or infection Lymph nodes no cervical, supraclavicular or inguinal nodes palpable Lungs: Clear equal breath sounds Cardiac: Regular rate and rhythm. No edema Abdomen: Soft and nontender. No hernias. No organomegaly Extremities: No edema or joint swelling Neurologic: Alert and fully oriented. Gait normal. No sensory deficits.  Assessment & Plan  1.  GASTRIC BYPASS STATUS FOR OBESITY (Z98.84)  Impression: 1 year Status post gastric bypass with some degree of anastomotic stricture and small marginal ulcer. She continues to have some symptoms of over restriction and some continued weight loss although overall appears well and feels well. Continue Protonix.   Discussed with Dr. Excell Seltzer.  For upper endoscopy.  Patient understands indications and risks of upper endoscopy.  The greater risk will be if she needs  a dilatation.  The major risk for this is perforation.  2. History of sleep apnea 3.  History of migraine headaches 4. Back pain   Alphonsa Overall, MD, [facility] Surgery Pager: [phone] Office phone:  [phone]

## 2016-02-02 NOTE — Op Note (Signed)
02/02/2016  1:15 PM  PATIENT:  [name], 50 y.o., female, MRN: [id number]  PREOP DIAGNOSIS:  History of RYGB, epigastric and nausea pain with solid food  POSTOP DIAGNOSIS:   RYGB normal, no ulcer, anastomosis diameter at 13 mm -> dilated to 15 mm  PROCEDURE:  Esophagogastrojejunoscopy, dilatation of gastro-jejunal anastomosis to 15 mm [photo taken]  SURGEON:   Alphonsa Overall, M.D.  ANESTHESIA:   Propofol anesthesia provided by anesthesia  INDICATIONS FOR PROCEDURE:  Kristen Delacruz is a 50 y.o. (DOB: [birth year])  white  female whose primary care physician is Pcp Not In System and comes for upper endoscopy to evaluate her gastric bypass.  The patient had a RYGB on 10/26/2015 by Dr. Excell Seltzer.  Her initial weight was 260, BMI 43.3.  Her weight is now around 130, with a BMI below 25.  She has had epigastric pain eating solid food - particularly meats and peanuts. I last endoscoped her on 07/31/2015 and she had a small marginal ulcer at the EJ junction.   The indications and risks of the endoscopy were explained to the patient.  The risks include, but are not limited to, perforation, bleeding, or injury to the bowel.  If balloon dilatation is needed, the risk of perforation is higher.  PROCEDURE:  The patient was in room 1 at [facility] endoscopy unit.  The patient was monitored with a pulse oximetry, BP cuff, and EKG.  The patient has nasal O2 flowing during the procedure.  The patient was given propofol supervised by anesthesia.   A time out was held prior to the procedure.   The patient was positioned in the left lateral decubitus position.    A flexible Pentax endoscope was passed down the throat without difficulty.   Findings include:   Esophagus:   Normal   GE junction at:  35 cm   Stomach pouch: Normal post RYGB   Gastrojejunal anastomosis:   At 40 cm.  I saw no evidence of an ulcer this time.  She had at least 3 staples at the [place] - I was able to remove 2 staples.  The calculated anastomosis  was at least 13 mm in diameter.  Because of her symptoms, I used an endoscopic balloon dilator kit that was 5 cm in length and 07-07-14 mm diameters to dilate the GJ junction.  A photo was taken before and after.  I held to dilatation for 1 minute.  There was no bleeding or evidence of perforation after the dilatation.   Efferent jejunal limb:  25 cm Afferent jejunal limb:  10 cm   CLO test:  Not done  PLAN:   Photos taken and given to patient.    On what I found, I could not explain the patient's symtpoms.  In fact the anatomy post op Roux en Y gastric bypass looked very normal.  She woke up with severe retrosternal chest pain and epigastric pain, but this subsided over an hour with some Fentanyl.  She will follow up with Dr. Excell Seltzer in 4 to 6 weeks.  Alphonsa Overall, MD, [facility] Surgery Pager: [phone] Office phone:  [phone]

## 2016-02-02 NOTE — Transfer of Care (Signed)
Immediate Anesthesia Transfer of Care Note  Patient: Kristen Delacruz  Procedure(s) Performed: Procedure(s): UPPER ESOPHAGOGASTRODUODENOSCOPY (EGD) WITH PROPOFOL POSSIBLE DILATION   (N/A) BALLOON DILATION (N/A)  Patient Location: PACU  Anesthesia Type:MAC  Level of Consciousness:  sedated, patient cooperative and responds to stimulation  Airway & Oxygen Therapy:Patient Spontanous Breathing and Patient connected to face mask oxgen  Post-op Assessment:  Report given to PACU RN and Post -op Vital signs reviewed and stable  Post vital signs:  Reviewed and stable  Last Vitals:  Filed Vitals:   02/02/16 1155  BP: 101/63  Pulse: 48  Temp: 37 C  Resp: 11    Complications: No apparent anesthesia complications

## 2016-02-02 NOTE — Interval H&P Note (Signed)
History and Physical Interval Note:  02/02/2016 12:30 PM  Kristen Delacruz  has presented today for surgery, with the diagnosis of SlippedRYGB stricture   The various methods of treatment have been discussed with the patient and family.  Doing okay with liquids.  But solid food (meat, peanuts) cause pain.  Her weight is down to 129 (she has lost 131 pounds) - so she is somewhat afraid of continuing to lose weight.  After consideration of risks, benefits and other options for treatment, the patient has consented to  Procedure(s): UPPER ESOPHAGOGASTRODUODENOSCOPY (EGD) WITH PROPOFOL POSSIBLE DILATION   (N/A) BALLOON DILATION (N/A) as a surgical intervention .  The patient's history has been reviewed, patient examined, no change in status, stable for surgery.  I have reviewed the patient's chart and labs.  Questions were answered to the patient's satisfaction.     Pervis Macintyre H

## 2016-02-03 ENCOUNTER — Encounter (HOSPITAL_COMMUNITY): Payer: Self-pay | Admitting: Surgery

## 2016-02-03 NOTE — Addendum Note (Signed)
Addendum  created 02/03/16 1052 by Talbot Grumbling, CRNA   Modules edited: Charges VN

## 2016-04-08 ENCOUNTER — Encounter: Payer: Self-pay | Admitting: Dietician

## 2016-04-08 ENCOUNTER — Encounter: Payer: BLUE CROSS/BLUE SHIELD | Attending: Surgery | Admitting: Dietician

## 2016-04-08 DIAGNOSIS — Z713 Dietary counseling and surveillance: Secondary | ICD-10-CM | POA: Insufficient documentation

## 2016-04-08 DIAGNOSIS — Z9884 Bariatric surgery status: Secondary | ICD-10-CM | POA: Insufficient documentation

## 2016-04-08 NOTE — Progress Notes (Signed)
  Follow-up visit:  16 Months Post-Operative RYGB Surgery  Medical Nutrition Therapy:  Appt start time: [id number] end time:  955  Primary concerns today: Post-operative Bariatric Surgery Nutrition Management.  Kristen Delacruz returns having lost another 15 lbs in the last 6 months. She states she feels comfortable at her current weight and does not want to lose any more. Recently had esophageal dilation but starting to struggle to tolerate hamburger, eggs, fish, bread, pasta.   Surgery date: 11/25/14 Surgery type: RYGB Start weight at [facility]: 256 lbs on 09/11/14 Weight today: 126.6 Weight change: 15.4 lbs Total weight loss: 129.4 lbs  TANITA  BODY COMP RESULTS  10/28/14 12/10/14 01/20/15 02/20/15 07/09/15 10/08/15 04/08/16   BMI (kg/m^2) 41.8 38.9 35.1 32.8 26.1 22.9 20.4   Fat Mass (lbs) 137.5 123.5 108.0 96.5 59 47 32.6   Fat Free Mass (lbs) 121.5 117.5 109.5 107.0 103 95 94   Total Body Water (lbs) 89 86.0 80.0 78.5 75.5 69.5 64.6    Preferred Learning Style:   No preference indicated   Learning Readiness:   Ready  24-hr recall: B (AM): Premier Protein (30g) Snk (AM): cheese and crackers or fruit L (PM): grilled chicken sandwich with most of bread "pulled off" OR chili OR [place] food Snk (PM): peanut butter crackers or fruit D (PM): steak and onions with cheese OR grilled chicken and veggies OR chili beans OR chicken noodle soup OR chicken pie (10g) Snk (PM): peanut butter crackers, second protein shake (5-30g)  Fluid intake: 64 oz water, 20 oz protein shake, Powerade Zero, occasionally apple juice (84 oz) Estimated total protein intake: 50-80g/day  Medications: see list  Supplementation: taking; working on taking Calcium as prescribed  Using straws: sometimes Drinking while eating: drinking less than 30 minutes after meals Hair loss: having regrowth Carbonated beverages: tried a Dr. Malachi Bonds N/V/D/C: nausea when she has "anything with sugar"; constipation resolved with 100% apple  juice Dumping syndrome: no  Recent physical activity:  none  Progress Towards Goal(s):  In progress.  Handouts given during visit include:  none   Nutritional Diagnosis:  [place]-3.3 Overweight/obesity related to past poor dietary habits and physical inactivity as evidenced by patient w/ recent RYGB surgery following dietary guidelines for continued weight loss.    Intervention:  Nutrition education/diet advancement  Teaching Method Utilized:  Visual Auditory Hands on  Barriers to learning/adherence to lifestyle change: narrowing of esophagus  Demonstrated degree of understanding via:  Teach Back   Monitoring/Evaluation:  Dietary intake, exercise, and body weight. Follow up prn.

## 2016-04-08 NOTE — Patient Instructions (Addendum)
Goals:  Increase strength training   Increase calories in the form of healthy fats: nuts, nut butters, 2% fat yogurt  Protein shake: -Protein powder, 1/2 cup fresh or frozen fruit, milk -Protein powders: Unjury, Isopure, EAS, etc. -Protein greater that 15g and total carbs less than 10g   TANITA  BODY COMP RESULTS  10/28/14 12/10/14 01/20/15 02/20/15 07/09/15 10/08/15 04/08/16   BMI (kg/m^2) 41.8 38.9 35.1 32.8 26.1 22.9 20.4   Fat Mass (lbs) 137.5 123.5 108.0 96.5 59 47 32.6   Fat Free Mass (lbs) 121.5 117.5 109.5 107.0 103 95 94   Total Body Water (lbs) 89 86.0 80.0 78.5 75.5 69.5 64.6

## 2016-08-31 ENCOUNTER — Other Ambulatory Visit: Payer: Self-pay | Admitting: General Surgery

## 2016-08-31 DIAGNOSIS — R1013 Epigastric pain: Secondary | ICD-10-CM

## 2016-09-02 ENCOUNTER — Ambulatory Visit
Admission: RE | Admit: 2016-09-02 | Discharge: 2016-09-02 | Disposition: A | Discharge status: Home / Self Care | Payer: BLUE CROSS/BLUE SHIELD | Source: Ambulatory Visit | Attending: General Surgery | Admitting: General Surgery

## 2016-09-02 DIAGNOSIS — R1013 Epigastric pain: Secondary | ICD-10-CM

## 2016-09-02 MED ORDER — IOPAMIDOL (ISOVUE-300) INJECTION 61%
100.0000 mL | Freq: Once | INTRAVENOUS | Status: AC | PRN
  Administered 2016-09-02: 100 mL via INTRAVENOUS

## 2016-09-03 ENCOUNTER — Other Ambulatory Visit: Payer: Self-pay | Admitting: Surgery

## 2016-09-19 NOTE — H&P (Signed)
Kristen Delacruz  Location: [facility] Patient #: [id number] DOB: [birth year] Married / Language: English / Race: White Female   History of Present Illness   The patient is a 51 year old female presenting status-post bariatric surgery. 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 Jacinto Reap. [place].  Her initial weight was 260, BMI 43.3.  Now one year postoperative. She had no postoperative complications and on her first visit at 3 weeks she was doing very well. Following this she developed some progressive dysphagia and occasional regurgitation and epigastric discomfort with solid foods. We obtained a barium swallow which showed some narrowing at her gastrojejunostomy with free flow of liquid but the barium tablet did not pass. We then backed off to a mostly liquid diet. Her symptoms continued and she underwent upper endoscopy 5 months postoperatively by Dr. Lucia Gaskins. He found a small marginal ulcer and a 12 mm anastomosis that he did not feel was tight enough to warrant dilatation at this time. She continued to have symptoms of postprandial pain and nausea and underwent a repeat endoscopy on July 31, 2015. This showed a very questionable small marginal ulcer, and anastomosis estimated to be 13 mm in overall impression that it looked improved from her original endoscopy. Dilatation was not performed.   Her symptoms continued however and she underwent repeat endoscopy in July of last year with dilatation. She states that this definitely improved her ability to eat and nausea but it has gradually recurred. She will sometimes be able to eat hardly anything due to pain and nausea and other times will be able to eat normal amounts. She is tolerating solid foods. Her weight has declined somewhat but by her records has been stable over the past 2 months. Current rate is 124 which is a 136 pound weight loss.   A new issue is 3 episodes of  what she describes as severe cramping epigastric pain over the last several weeks. 2 of these awakened her from sleep. Not related to eating and no associated symptoms. They resolve spontaneously after about 15 minutes. She is status post cholecystectomy.    Allergies Bary Castilla [employer], [place]; 08/27/2016 4:48 PM) Advair Diskus *ANTIASTHMATIC AND BRONCHODILATOR AGENTS*  Rash, Swelling. Fluticasone Propionate *NASAL AGENTS - SYSTEMIC AND TOPICAL*   Medication History Nance Pear, CMA; 08/27/2016 4:48 PM) Zofran ODT (4MG  Tablet Disint, 1 (one) Oral every four hours, as needed, Taken starting 12/26/2015) Active. Tylenol Extra Strength (500MG  Tablet, Oral as needed) Active. Calcium-Phosphorus-Vitamin D (140-25-100MG -MG-UNIT Wafer, Oral daily) Active. Vitamin B12 (100MCG Tablet, Oral daily) Active. Pataday (0.2% Solution, Ophthalmic as needed) Active. Multi Vitamin Daily (Oral daily) Active. Xopenex Concentrate (1.25MG /0.5ML Nebulized Soln, Inhalation as needed) Active. Medications Reconciled  Vitals Bary Castilla [place] CMA; 08/27/2016 4:49 PM) 08/27/2016 4:48 PM Weight: 123.8 lb Height: 65in Body Surface Area: 1.61 m Body Mass Index: 20.6 kg/m  Temp.: 97.57F  Pulse: 68 (Regular)  BP: 112/72 (Sitting, Left Arm, Standard)   Physical Exam  General: Thin but not unwell-appearing Caucasian female Skin: No rash or infection Lungs: Wheezing or increased work of breathing Abdomen: Nondistended. Well-healed incisions. Mild epigastric tenderness. No guarding. No masses or organomegaly. Extremities: No joint swelling or edema Neurologic: Alert and fully oriented. Gait normal.   Assessment & Plan  1.  GASTRIC BYPASS STATUS FOR OBESITY (Z98.84)  Impression: Over one and a half years Status post gastric bypass with some degree of anastomotic stricture and small marginal  ulcer on previous endoscopy. Noticed improvement after dilatation last year. Weight has declined somewhat but  apparently stable over 2 months.   With this history and think it would be appropriate to consider repeat endoscopy and dilatation.  She has had 3 endoscopies - the last was 02/02/2016 with dilatation to 15 mm.  Plan repeat endo - if ulcer --> treat, if no ulcer --> consider dilatation  2.  Abdominal pain  She has had 3 episodes of acute severe epigastric pain resolving after about 15 minutes. She is status post cholecystectomy. I think we need to rule out internal hernia.   We'll start with a CT scan of the abdomen and pelvis and call her with these results. If she has any further episodes regardless of the CT scan I think she will require laparoscopy. This could be combined with EGD and dilatation.  CT scan of abdomen/pelvis - 09/02/2016 - was negative  3. Sleep apnea 4. History of migraine headaches 5. Back pain  Alphonsa Overall, MD, [facility] Surgery Pager: [phone] Office phone:  [phone]

## 2016-09-20 ENCOUNTER — Ambulatory Visit (HOSPITAL_COMMUNITY): Payer: BLUE CROSS/BLUE SHIELD | Admitting: Certified Registered Nurse Anesthetist

## 2016-09-20 ENCOUNTER — Encounter (HOSPITAL_COMMUNITY): Payer: Self-pay

## 2016-09-20 ENCOUNTER — Encounter (HOSPITAL_COMMUNITY)
Admission: RE | Disposition: A | Discharge status: Home / Self Care | Payer: Self-pay | Source: Ambulatory Visit | Attending: Surgery

## 2016-09-20 ENCOUNTER — Ambulatory Visit (HOSPITAL_COMMUNITY)
Admission: RE | Admit: 2016-09-20 | Discharge: 2016-09-20 | Disposition: A | Discharge status: Home / Self Care | Payer: BLUE CROSS/BLUE SHIELD | Source: Ambulatory Visit | Attending: Surgery | Admitting: Surgery

## 2016-09-20 DIAGNOSIS — Z79899 Other long term (current) drug therapy: Secondary | ICD-10-CM | POA: Diagnosis not present

## 2016-09-20 DIAGNOSIS — R110 Nausea: Secondary | ICD-10-CM | POA: Diagnosis not present

## 2016-09-20 DIAGNOSIS — J45909 Unspecified asthma, uncomplicated: Secondary | ICD-10-CM | POA: Diagnosis not present

## 2016-09-20 DIAGNOSIS — R1013 Epigastric pain: Secondary | ICD-10-CM | POA: Insufficient documentation

## 2016-09-20 DIAGNOSIS — G4730 Sleep apnea, unspecified: Secondary | ICD-10-CM | POA: Diagnosis not present

## 2016-09-20 DIAGNOSIS — Z9049 Acquired absence of other specified parts of digestive tract: Secondary | ICD-10-CM | POA: Diagnosis not present

## 2016-09-20 DIAGNOSIS — Z9884 Bariatric surgery status: Secondary | ICD-10-CM | POA: Insufficient documentation

## 2016-09-20 HISTORY — PX: ESOPHAGOGASTRODUODENOSCOPY (EGD) WITH PROPOFOL: SHX5813

## 2016-09-20 SURGERY — ESOPHAGOGASTRODUODENOSCOPY (EGD) WITH PROPOFOL
Anesthesia: Monitor Anesthesia Care

## 2016-09-20 MED ORDER — SODIUM CHLORIDE 0.9 % IV SOLN: INTRAVENOUS | Status: DC

## 2016-09-20 MED ORDER — LIDOCAINE 2% (20 MG/ML) 5 ML SYRINGE
INTRAMUSCULAR | Status: AC
  Filled 2016-09-20: qty 5

## 2016-09-20 MED ORDER — PROPOFOL 10 MG/ML IV BOLUS
INTRAVENOUS | Status: AC
  Filled 2016-09-20: qty 20

## 2016-09-20 MED ORDER — PROPOFOL 10 MG/ML IV BOLUS
INTRAVENOUS | Status: AC
Start: 2016-09-20 — End: ?
  Filled 2016-09-20: qty 20

## 2016-09-20 MED ORDER — FENTANYL CITRATE (PF) 100 MCG/2ML IJ SOLN
INTRAMUSCULAR | Status: DC | PRN
  Administered 2016-09-20 (×2): 25 ug via INTRAVENOUS

## 2016-09-20 MED ORDER — PROPOFOL 10 MG/ML IV BOLUS
INTRAVENOUS | Status: DC | PRN
Start: 2016-09-20 — End: 2016-09-20
  Administered 2016-09-20 (×3): 20 mg via INTRAVENOUS
  Administered 2016-09-20: 10 mg via INTRAVENOUS
  Administered 2016-09-20: 20 mg via INTRAVENOUS
  Administered 2016-09-20: 50 mg via INTRAVENOUS

## 2016-09-20 MED ORDER — LACTATED RINGERS IV SOLN
INTRAVENOUS | Status: DC | PRN
  Administered 2016-09-20: 07:00:00 via INTRAVENOUS

## 2016-09-20 MED ORDER — PROPOFOL 500 MG/50ML IV EMUL
INTRAVENOUS | Status: DC | PRN
  Administered 2016-09-20: 150 ug/kg/min via INTRAVENOUS

## 2016-09-20 MED ORDER — FENTANYL CITRATE (PF) 100 MCG/2ML IJ SOLN
INTRAMUSCULAR | Status: AC
  Filled 2016-09-20: qty 2

## 2016-09-20 MED ORDER — ONDANSETRON HCL 4 MG/2ML IJ SOLN
INTRAMUSCULAR | Status: DC | PRN
  Administered 2016-09-20: 4 mg via INTRAVENOUS

## 2016-09-20 MED ORDER — PHENYLEPHRINE 40 MCG/ML (10ML) SYRINGE FOR IV PUSH (FOR BLOOD PRESSURE SUPPORT)
PREFILLED_SYRINGE | INTRAVENOUS | Status: DC | PRN
  Administered 2016-09-20: 40 ug via INTRAVENOUS
  Administered 2016-09-20: 80 ug via INTRAVENOUS
  Administered 2016-09-20: 40 ug via INTRAVENOUS

## 2016-09-20 MED ORDER — LIDOCAINE 2% (20 MG/ML) 5 ML SYRINGE
INTRAMUSCULAR | Status: DC | PRN
  Administered 2016-09-20: 75 mg via INTRAVENOUS

## 2016-09-20 MED ORDER — PHENYLEPHRINE 40 MCG/ML (10ML) SYRINGE FOR IV PUSH (FOR BLOOD PRESSURE SUPPORT)
PREFILLED_SYRINGE | INTRAVENOUS | Status: AC
  Filled 2016-09-20: qty 10

## 2016-09-20 MED ORDER — ONDANSETRON HCL 4 MG/2ML IJ SOLN
INTRAMUSCULAR | Status: AC
  Filled 2016-09-20: qty 2

## 2016-09-20 SURGICAL SUPPLY — 15 items

## 2016-09-20 NOTE — Anesthesia Postprocedure Evaluation (Signed)
Anesthesia Post Note  Patient: Kristen Delacruz  Procedure(s) Performed: Procedure(s) (LRB): ESOPHAGOGASTRODUODENOSCOPY (EGD) WITH PROPOFOL POSSIBLE DILATATION (N/A)  Patient location during evaluation: PACU Anesthesia Type: MAC Level of consciousness: awake and alert Pain management: pain level controlled Vital Signs Assessment: post-procedure vital signs reviewed and stable Respiratory status: spontaneous breathing, nonlabored ventilation, respiratory function stable and patient connected to nasal cannula oxygen Cardiovascular status: stable and blood pressure returned to baseline Anesthetic complications: no       Last Vitals:  Vitals:   09/20/16 0627 09/20/16 0815  BP: (!) 102/59   Pulse: (!) 50   Resp: (!) 8   Temp: 36.6 C 36.5 C    Last Pain:  Vitals:   09/20/16 0815  TempSrc: Oral                 Sabrinia Prien S

## 2016-09-20 NOTE — Interval H&P Note (Signed)
History and Physical Interval Note:  09/20/2016 7:36 AM  Kristen Delacruz  has presented today for surgery, with the diagnosis of History of gastric bypass, prior stenosis of gastrojejunostomy  The various methods of treatment have been discussed with the patient and family.  Husband at bedside.  After consideration of risks, benefits and other options for treatment, the patient has consented to  Procedure(s): ESOPHAGOGASTRODUODENOSCOPY (EGD) WITH PROPOFOL POSSIBLE DILATATION (N/A) as a surgical intervention .  The patient's history has been reviewed, patient examined, no change in status, stable for surgery.  I have reviewed the patient's chart and labs.  Questions were answered to the patient's satisfaction.     Lakiya Cottam H

## 2016-09-20 NOTE — Anesthesia Preprocedure Evaluation (Signed)
Anesthesia Evaluation  Patient identified by MRN, date of birth, ID band Patient awake    Reviewed: Allergy & Precautions, NPO status , Patient's Chart, lab work & pertinent test results  Airway Mallampati: II  TM Distance: >3 FB Neck ROM: Full    Dental no notable dental hx.    Pulmonary asthma , sleep apnea ,    Pulmonary exam normal breath sounds clear to auscultation       Cardiovascular negative cardio ROS Normal cardiovascular exam Rhythm:Regular Rate:Normal     Neuro/Psych negative neurological ROS  negative psych ROS   GI/Hepatic Neg liver ROS, GERD  ,  Endo/Other  negative endocrine ROS  Renal/GU negative Renal ROS  negative genitourinary   Musculoskeletal negative musculoskeletal ROS (+)   Abdominal   Peds negative pediatric ROS (+)  Hematology negative hematology ROS (+)   Anesthesia Other Findings   Reproductive/Obstetrics negative OB ROS                             Anesthesia Physical Anesthesia Plan  ASA: II  Anesthesia Plan: MAC   Post-op Pain Management:    Induction: Intravenous  Airway Management Planned: Nasal Cannula  Additional Equipment:   Intra-op Plan:   Post-operative Plan:   Informed Consent: I have reviewed the patients History and Physical, chart, labs and discussed the procedure including the risks, benefits and alternatives for the proposed anesthesia with the patient or authorized representative who has indicated his/her understanding and acceptance.   Dental advisory given  Plan Discussed with: CRNA and Surgeon  Anesthesia Plan Comments:         Anesthesia Quick Evaluation

## 2016-09-20 NOTE — Op Note (Signed)
MARELYN ROUSER [id number] 09/20/16  8:15 AM  PATIENT:  Kristen Delacruz, 51 y.o., female, MRN: [id number]  PREOP DIAGNOSIS:  History of RYGB, epigastric pain and nausea with solid food  POSTOP DIAGNOSIS:   RYGB normal, no ulcer, anastomosis diameter at 13 mm -> dilated to 15 mm  PROCEDURE:  Esophagogastrojejunoscopy, dilatation of gastro-jejunal anastomosis to 15 mm  [photos taken]  SURGEON:   Alphonsa Overall, M.D.  ANESTHESIA:   Propofol anesthesia provided by anesthesia  INDICATIONS FOR PROCEDURE:             Kristen Delacruz is a 51 y.o. (DOB: [birth year])  white  female whose primary care physician is Pcp Not In System and comes for upper endoscopy to evaluate her gastric bypass.  The patient had a RYGB on 10/26/2015 by Dr. Excell Seltzer.  Her initial weight was 260, BMI 43.3.  Her weight is now around 130, with a BMI below 25.     She did well with the last dilatation until about 2 months ago.  She has had some trouble with solids.  She has also had a couple of bouts of severe abdominal pain.  A CT scan of her abdomen on  09/02/2016 was negatvie.     I have endoscoped her three times:  03/25/2015, 07/31/2015 and 02/02/2016.  The first two endoscopies she had a small marginal ulcer.  The last endoscopy, she did not have an ulcer, so I dilated her to 15 mm.              The indications and risks of the endoscopy were explained to the patient.  The risks include, but are not limited to, perforation, bleeding, or injury to the bowel.  If balloon dilatation is needed, the risk of perforation is higher.  PROCEDURE:             The patient was in room 1 at [facility] endoscopy unit.  The patient was monitored with a pulse oximetry, BP cuff, and EKG.  The patient has nasal O2 flowing during the procedure.  The patient was given propofol supervised by anesthesia.              A time out was held prior to the procedure.              The patient was positioned in the left lateral decubitus position.    A  flexible Pentax endoscope was passed down the throat without difficulty.   Findings include:              Esophagus:   Normal              GE junction at:  36 cm              Stomach pouch: Normal post RYGB              Gastrojejunal anastomosis:   At 41 cm.  I saw no evidence of an ulcer.  She had at least 1 staple at the [place].  The calculated anastomosis was about a 13 mm in diameter.  The anastomosis appeared to have a larger diameter than when I last endoscoped her.  Because of her symptoms, I used an endoscopic balloon dilator kit that was 8 cm in length and 07-07-14 mm diameters to dilate the GJ junction.   I held the dilatation at each size for 1 minute.  There was no bleeding or evidence of perforation after the dilatation.  Efferent jejunal limb:  Normal, I advanced 25 cm   Afferent jejunal limb:  Normal, I advanced 10 cm              CLO test:  Done  PLAN:   Photos taken and given to patient.               The anastomosis looked more wide open that what I saw last time.  I still dilated her.  She did have a small amount of retained food stuff that bounced between the jejunum and stomach.  I do not think the food stuff was hung up at the [place].             She will follow up with Dr. Excell Seltzer in 4 to 6 weeks.  Alphonsa Overall, MD, [facility] Surgery Pager: [phone] Office phone:  [phone]

## 2016-09-20 NOTE — Transfer of Care (Signed)
Immediate Anesthesia Transfer of Care Note  Patient: Kristen Delacruz  Procedure(s) Performed: Procedure(s): ESOPHAGOGASTRODUODENOSCOPY (EGD) WITH PROPOFOL POSSIBLE DILATATION (N/A)  Patient Location: PACU  Anesthesia Type:MAC  Level of Consciousness: Patient easily awoken, sedated, comfortable, cooperative, following commands, responds to stimulation.   Airway & Oxygen Therapy: Patient spontaneously breathing, ventilating well, oxygen via simple oxygen mask.  Post-op Assessment: Report given to PACU RN, vital signs reviewed and stable, moving all extremities.   Post vital signs: Reviewed and stable.  Complications: No apparent anesthesia complications  Last Vitals:  Vitals:   09/20/16 0627  BP: (!) 102/59  Pulse: (!) 50  Resp: (!) 8  Temp: 36.6 C    Last Pain:  Vitals:   09/20/16 0627  TempSrc: Oral         Complications: No apparent anesthesia complications

## 2016-09-20 NOTE — Discharge Instructions (Signed)
[facility] [place] [facility] - DISCHARGE INSTRUCTIONS TO PATIENT  Activity:  Driving - May drive tomorrow  Diet:  Liquids today, the resume solids tomorrow.  Follow up appointment:  Call Dr. Lear Ng office [facility]) at [phone] for an appointment in 6 weeks.  Medications and dosages:  Resume your home medications.  Call Dr. Excell Seltzer or his office  [phone]) if you have:  Temperature greater than 100.4,  Persistent nausea and vomiting,  Severe uncontrolled pain,  Any other questions or concerns you may have after discharge.  In an emergency, call 911 or go to an Emergency Department at a nearby hospital.

## 2016-09-21 LAB — CLOTEST (H. PYLORI), BIOPSY: Helicobacter screen: NEGATIVE

## 2016-09-22 ENCOUNTER — Encounter (HOSPITAL_COMMUNITY): Payer: Self-pay | Admitting: Surgery

## 2016-09-22 IMAGING — DX DG CHEST 2V
2 series · 2 of 2 positions shown · non-contrast
Comparison: None.

CLINICAL DATA: Pre bariatric screening, nonsmoker, asymptomatic.

EXAM:
CHEST  2 VIEW

[chest pa]
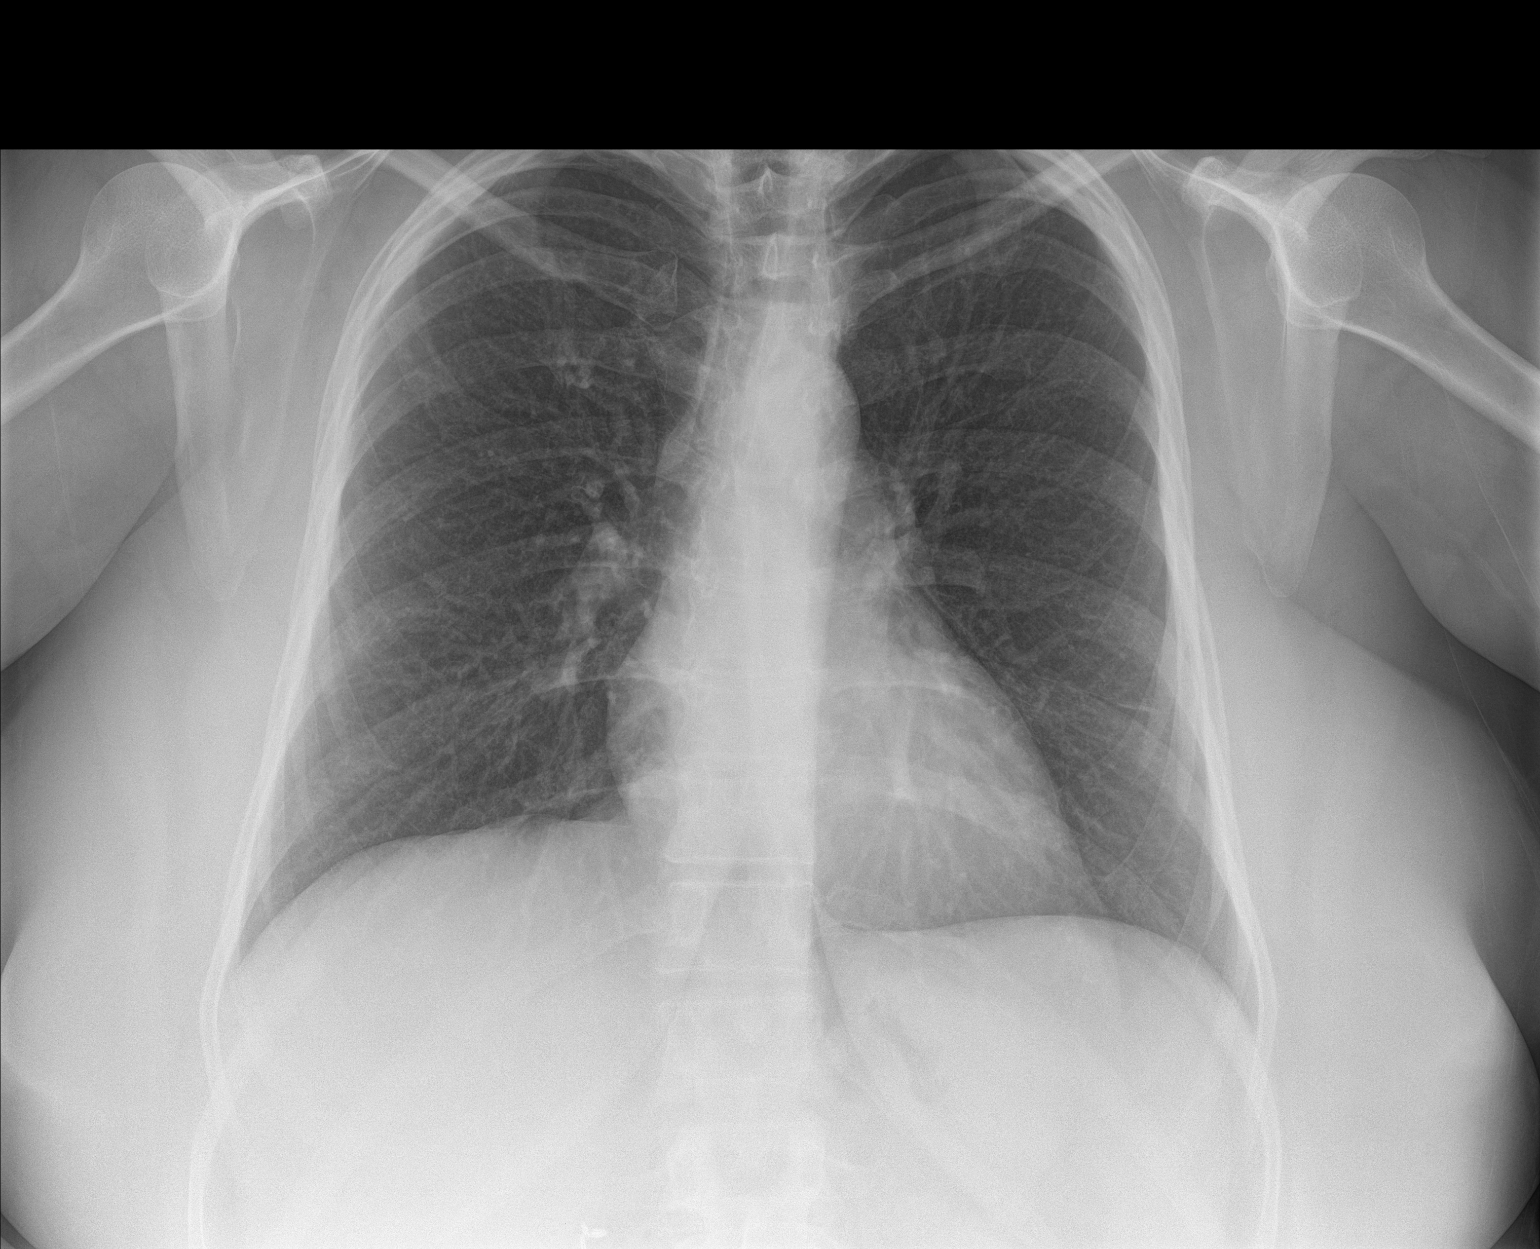

[chest lat]
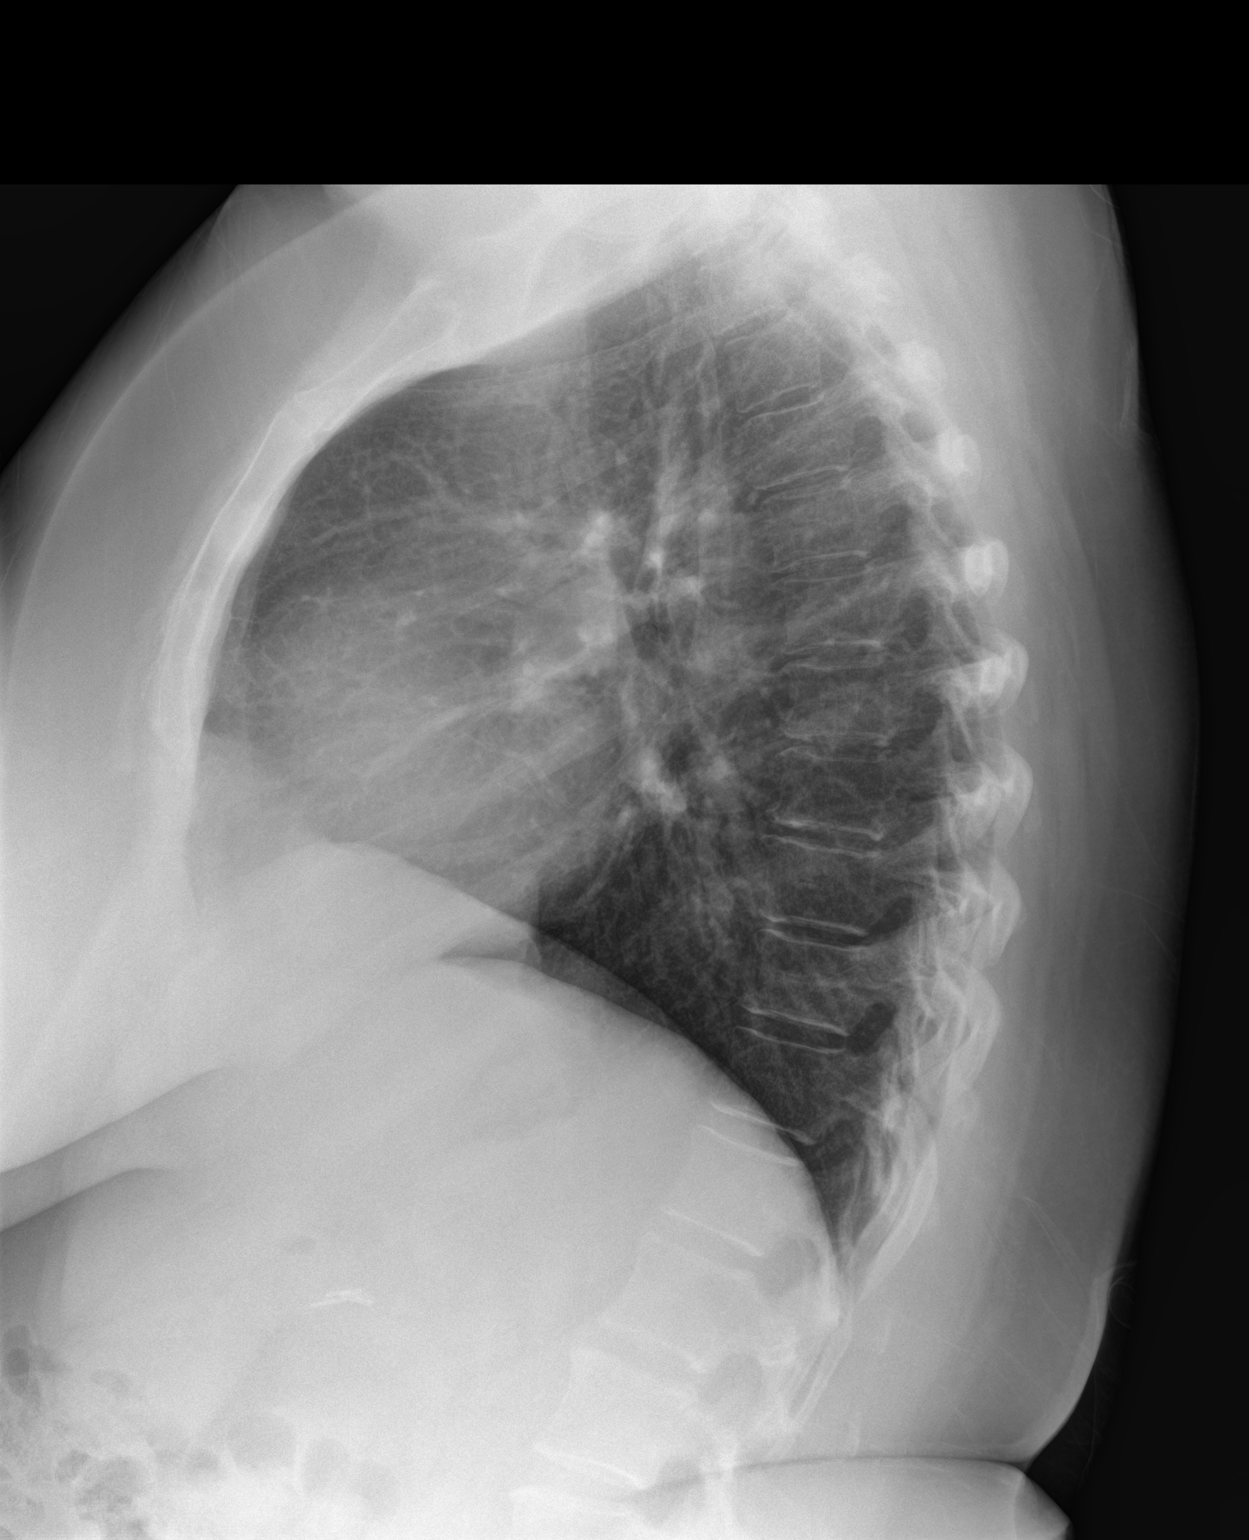

[2 of 2 positions shown; findings below may reference images not displayed]

FINDINGS: The lungs are adequately inflated and clear. The heart and pulmonary
vascularity are normal. The mediastinum is normal in width. There is
no pleural effusion. The bony thorax is unremarkable.
IMPRESSION: There is no active cardiopulmonary disease.

## 2016-10-17 ENCOUNTER — Telehealth: Payer: Self-pay | Admitting: General Surgery

## 2016-10-17 NOTE — Telephone Encounter (Signed)
She called this morning reporting that she had a squeezing type stomach pain last night, like she has had before.  No nausea or vomiting.  No fever or chills.  She is s/p Lap Roux en Y gastric bypass in 2016 by Dr. Excell Seltzer.  She has had an anastomotic stricture and was last dilated by Dr. Lucia Gaskins 09/20/16.  I instructed her to stay on a clear liquid diet and call the office in the morning to speak with Dr. Excell Seltzer or his assistant about being seen.

## 2016-10-20 ENCOUNTER — Ambulatory Visit: Payer: Self-pay | Admitting: General Surgery

## 2016-11-17 NOTE — Patient Instructions (Addendum)
Kristen Delacruz  11/17/2016   Your procedure is scheduled on: 11/30/2016    Report to [facility] Main  Entrance and take [facility] elevators to 3rd floor to [facility] at 0530am.  .   Call this number if you have problems the morning of surgery [phone]    Remember: ONLY 1 PERSON MAY GO WITH YOU TO SHORT STAY TO GET  READY MORNING OF [place].  Do not eat food or drink liquids :After Midnight.     Take these medicines the morning of surgery with A SIP OF WATER: protonix                                 You may not have any metal on your body including hair pins and              piercings  Do not wear jewelry, make-up, lotions, powders or perfumes, deodorant             Do not wear nail polish.  Do not shave  48 hours prior to surgery.     Do not bring valuables to the hospital. [place].  Contacts, dentures or bridgework may not be worn into surgery.      Patients discharged the day of surgery will not be allowed to drive home.  Name and phone number of your driver:               Please read over the following fact sheets you were given: _____________________________________________________________________             [facility] - Preparing for Surgery Before surgery, you can play an important role.  Because skin is not sterile, your skin needs to be as free of germs as possible.  You can reduce the number of germs on your skin by washing with CHG (chlorahexidine gluconate) soap before surgery.  CHG is an antiseptic cleaner which kills germs and bonds with the skin to continue killing germs even after washing. Please DO NOT use if you have an allergy to CHG or antibacterial soaps.  If your skin becomes reddened/irritated stop using the CHG and inform your nurse when you arrive at Short Stay. Do not shave (including legs and underarms) for at least 48 hours prior to the first CHG shower.  You  may shave your face/neck. Please follow these instructions carefully:  1.  Shower with CHG Soap the night before surgery and the  morning of Surgery.  2.  If you choose to wash your hair, wash your hair first as usual with your  normal  shampoo.  3.  After you shampoo, rinse your hair and body thoroughly to remove the  shampoo.                           4.  Use CHG as you would any other liquid soap.  You can apply chg directly  to the skin and wash                       Gently with a scrungie or clean washcloth.  5.  Apply the CHG Soap to your body ONLY  FROM THE NECK DOWN.   Do not use on face/ open                           Wound or open sores. Avoid contact with eyes, ears mouth and genitals (private parts).                       Wash face,  Genitals (private parts) with your normal soap.             6.  Wash thoroughly, paying special attention to the area where your surgery  will be performed.  7.  Thoroughly rinse your body with warm water from the neck down.  8.  DO NOT shower/wash with your normal soap after using and rinsing off  the CHG Soap.                9.  Pat yourself dry with a clean towel.            10.  Wear clean pajamas.            11.  Place clean sheets on your bed the night of your first shower and do not  sleep with pets. Day of Surgery : Do not apply any lotions/deodorants the morning of surgery.  Please wear clean clothes to the hospital/surgery center.  FAILURE TO FOLLOW THESE INSTRUCTIONS MAY RESULT IN THE CANCELLATION OF YOUR SURGERY PATIENT SIGNATURE_________________________________  NURSE SIGNATURE__________________________________  ________________________________________________________________________

## 2016-11-23 ENCOUNTER — Encounter (INDEPENDENT_AMBULATORY_CARE_PROVIDER_SITE_OTHER): Payer: Self-pay

## 2016-11-23 ENCOUNTER — Encounter (HOSPITAL_COMMUNITY): Payer: Self-pay

## 2016-11-23 ENCOUNTER — Encounter (HOSPITAL_COMMUNITY)
Admission: RE | Admit: 2016-11-23 | Discharge: 2016-11-23 | Disposition: A | Discharge status: Home / Self Care | Payer: BLUE CROSS/BLUE SHIELD | Source: Ambulatory Visit | Attending: General Surgery | Admitting: General Surgery

## 2016-11-23 DIAGNOSIS — R109 Unspecified abdominal pain: Secondary | ICD-10-CM | POA: Insufficient documentation

## 2016-11-23 DIAGNOSIS — Z01812 Encounter for preprocedural laboratory examination: Secondary | ICD-10-CM | POA: Diagnosis present

## 2016-11-23 DIAGNOSIS — Z9884 Bariatric surgery status: Secondary | ICD-10-CM | POA: Insufficient documentation

## 2016-11-23 HISTORY — DX: Malignant (primary) neoplasm, unspecified: C80.1

## 2016-11-23 LAB — CBC
HCT: 40.4 % (ref 36.0–46.0)
Hemoglobin: 13.7 g/dL (ref 12.0–15.0)
MCH: 32.5 pg (ref 26.0–34.0)
MCHC: 33.9 g/dL (ref 30.0–36.0)
MCV: 95.7 fL (ref 78.0–100.0)
PLATELETS: 201 10*3/uL (ref 150–400)
RBC: 4.22 MIL/uL (ref 3.87–5.11)
RDW: 12.8 % (ref 11.5–15.5)
WBC: 8.4 10*3/uL (ref 4.0–10.5)

## 2016-11-29 NOTE — H&P (Signed)
History of Present Illness  The patient is a 51 year old female presenting status-post bariatric surgery. Kristen Delacruz is a 51 year old patient who underwent Roux En Y Gastric Bypass on Nov 25, 2014. Now one year postoperative. She had no postoperative complications and on her first visit at 3 weeks she was doing very well. Following this she developed some progressive dysphagia and occasional regurgitation and epigastric discomfort with solid foods. We obtained a barium swallow which showed some narrowing at her gastrojejunostomy with free flow of liquid but the barium tablet did not pass. We then backed off to a mostly liquid diet. Her symptoms continued and she underwent upper endoscopy 5 months postoperatively by Dr. Lucia Gaskins. He found a small marginal ulcer and a 12 mm anastomosis that he did not feel was tight enough to warrant dilatation at this time. She continued to have symptoms of postprandial pain and nausea and underwent a repeat endoscopy on July 31, 2015. This showed a very questionable small marginal ulcer, and anastomosis estimated to be 13 mm in overall impression that it looked improved from her original endoscopy. Dilatation was not performed. Her symptoms continued however and she underwent repeat endoscopy in July of last year with dilatation. She states that this definitely improved her ability to eat and nausea but it has gradually recurred. She will sometimes be able to eat hardly anything due to pain and nausea and other times will be able to eat normal amounts. She is tolerating solid foods. Her weight has declined somewhat but by her records has been stable over the past 2 months. Current rate is 123 which is a 137 pound weight loss.  A new issue is episodes of what she describes as severe cramping epigastric pain over the last several weeks. 2 of these awakened her from sleep. Not related to eating and no associated symptoms. They resolve spontaneously after  about 15 minutes. She is status post cholecystectomy. We obtained a CT scan which was unremarkable. She however had another episode of very severe crampy epigastric and midabdominal pain that took her breath away and awakened her from sleep last weekend.    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  Diagnostic Studies History  Colonoscopy  never Mammogram  1-3 years ago  Allergies  Advair Diskus *ANTIASTHMATIC AND BRONCHODILATOR AGENTS*  Rash, Swelling. Fluticasone Propionate *NASAL AGENTS - SYSTEMIC AND TOPICAL*  Allergies Reconciled   Medication History  Zofran ODT (4MG  Tablet Disint, 1 (one) Oral every four hours, as needed, Taken starting 12/26/2015) Active. Protonix (40MG  Tablet DR, 1 (one) Tablet Oral two times daily, Taken starting 08/27/2016) Active. Tylenol Extra Strength (500MG  Tablet, Oral as needed) Active. Calcium-Phosphorus-Vitamin D (140-25-100MG -MG-UNIT Wafer, Oral daily) Active. Vitamin B12 (100MCG Tablet, Oral daily) Active. Pataday (0.2% Solution, Ophthalmic as needed) Active. Multi Vitamin Daily (Oral daily) Active. Xopenex Concentrate (1.25MG /0.5ML Nebulized Soln, Inhalation as needed) Active. Medications Reconciled  Pregnancy / Birth History Age at menarche  64 years. Gravida  3 Maternal age  18-20 Para  3  Other Problems Sleep Apnea  General anesthesia - complications  Migraine Headache  Gastroesophageal Reflux Disease  Back Pain  Bladder Problems  Asthma   Vitals   BP: 92/66 (Sitting, Left Arm,  Standard) BP recheck  Weight: 122.4 lb Height: 65in Body Surface Area: 1.61 m Body Mass Index: 20.37 kg/m  Temp.: 97.98F  Pulse: 69 (Regular)  BP: 88/64 (Sitting, Left Arm, Standard)       Physical Exam The physical exam findings are as follows: Note:General: Thin Caucasian female who otherwise appears well Skin: Tanned, no rash infection Lungs: Clear his respirations without wheezing or increased work of breathing Cardiac: Regular rate and rhythm without murmurs. No edema Abdomen: Nondistended. Soft and nontender. No palpable hernias. No palpable masses or organomegaly. Extremities: No edema or deformity Neurologic: Alert and fully oriented. Gait normal.    Assessment & Plan  GASTRIC BYPASS STATUS FOR OBESITY (Z98.84) Impression: Over one and a half years Status post gastric bypass with some degree of anastomotic stricture and small marginal ulcer on previous endoscopy. Noticed improvement after dilatation last year. Weight has declined somewhat but apparently stable over 2 months. With this history I think it would be appropriate to consider repeat endoscopy and dilatation I will discuss this with Dr. Lucia Gaskins.  She has had 4 episodes of acute severe epigastric pain resolving after about 15 minutes. She is status post cholecystectomy. I think we need to rule out internal hernia. CT scan of the abdomen was unremarkable. She however has had a further episodes since and we discussed that she could have an internal hernia with a negative CT scan. Her previous plan was to proceed with laparoscopy if she were to have any further episodes. I think this is indicated and she is in agreement. As previously discussed also plan dilatation by Dr. Lucia Gaskins under general anesthesia during her laparoscopy. We discussed the nature of the procedure and indications, expected findings, risks of anesthetic complications or intestinal injury.

## 2016-11-30 ENCOUNTER — Encounter (HOSPITAL_COMMUNITY)
Admission: AD | Disposition: A | Discharge status: Home / Self Care | Payer: Self-pay | Source: Ambulatory Visit | Attending: General Surgery

## 2016-11-30 ENCOUNTER — Encounter (HOSPITAL_COMMUNITY): Payer: Self-pay | Admitting: *Deleted

## 2016-11-30 ENCOUNTER — Ambulatory Visit (HOSPITAL_COMMUNITY): Payer: BLUE CROSS/BLUE SHIELD | Admitting: Anesthesiology

## 2016-11-30 ENCOUNTER — Inpatient Hospital Stay (HOSPITAL_COMMUNITY)
Admission: AD | Admit: 2016-11-30 | Discharge: 2016-12-03 | DRG: 336 | Disposition: A | Discharge status: Home / Self Care | Payer: BLUE CROSS/BLUE SHIELD | Source: Ambulatory Visit | Attending: Internal Medicine | Admitting: Internal Medicine

## 2016-11-30 DIAGNOSIS — K458 Other specified abdominal hernia without obstruction or gangrene: Principal | ICD-10-CM | POA: Diagnosis present

## 2016-11-30 DIAGNOSIS — Z681 Body mass index (BMI) 19 or less, adult: Secondary | ICD-10-CM

## 2016-11-30 DIAGNOSIS — Z79899 Other long term (current) drug therapy: Secondary | ICD-10-CM

## 2016-11-30 DIAGNOSIS — E271 Primary adrenocortical insufficiency: Secondary | ICD-10-CM | POA: Diagnosis present

## 2016-11-30 DIAGNOSIS — Z90710 Acquired absence of both cervix and uterus: Secondary | ICD-10-CM

## 2016-11-30 DIAGNOSIS — R0789 Other chest pain: Secondary | ICD-10-CM | POA: Diagnosis present

## 2016-11-30 DIAGNOSIS — R109 Unspecified abdominal pain: Secondary | ICD-10-CM | POA: Diagnosis present

## 2016-11-30 DIAGNOSIS — I959 Hypotension, unspecified: Secondary | ICD-10-CM

## 2016-11-30 DIAGNOSIS — Z9049 Acquired absence of other specified parts of digestive tract: Secondary | ICD-10-CM

## 2016-11-30 DIAGNOSIS — G4730 Sleep apnea, unspecified: Secondary | ICD-10-CM | POA: Diagnosis present

## 2016-11-30 DIAGNOSIS — I9581 Postprocedural hypotension: Secondary | ICD-10-CM | POA: Diagnosis not present

## 2016-11-30 DIAGNOSIS — K660 Peritoneal adhesions (postprocedural) (postinfection): Secondary | ICD-10-CM | POA: Diagnosis present

## 2016-11-30 DIAGNOSIS — Z888 Allergy status to other drugs, medicaments and biological substances status: Secondary | ICD-10-CM

## 2016-11-30 DIAGNOSIS — K219 Gastro-esophageal reflux disease without esophagitis: Secondary | ICD-10-CM | POA: Diagnosis present

## 2016-11-30 DIAGNOSIS — I951 Orthostatic hypotension: Secondary | ICD-10-CM

## 2016-11-30 HISTORY — PX: ESOPHAGOGASTRODUODENOSCOPY: SHX5428

## 2016-11-30 HISTORY — PX: LAPAROSCOPIC INTERNAL HERNIA REPAIR: SHX6502

## 2016-11-30 SURGERY — LAPAROSCOPIC INTERNAL HERNIA REPAIR
Anesthesia: General

## 2016-11-30 MED ORDER — OXYCODONE HCL 5 MG PO TABS: 5.0000 mg | ORAL_TABLET | ORAL | Status: DC | PRN

## 2016-11-30 MED ORDER — OXYCODONE HCL 5 MG PO TABS
10.0000 mg | ORAL_TABLET | Freq: Four times a day (QID) | ORAL | Status: DC | PRN
  Administered 2016-12-01 (×2): 5 mg via ORAL
  Administered 2016-12-01: 10 mg via ORAL
  Filled 2016-11-30 (×3): qty 2

## 2016-11-30 MED ORDER — SCOPOLAMINE 1 MG/3DAYS TD PT72
MEDICATED_PATCH | TRANSDERMAL | Status: AC
  Administered 2016-11-30: 1.5 mg via TRANSDERMAL
  Filled 2016-11-30: qty 1

## 2016-11-30 MED ORDER — MIDAZOLAM HCL 2 MG/2ML IJ SOLN
INTRAMUSCULAR | Status: AC
  Filled 2016-11-30: qty 2

## 2016-11-30 MED ORDER — ENOXAPARIN SODIUM 40 MG/0.4ML ~~LOC~~ SOLN
40.0000 mg | SUBCUTANEOUS | Status: DC
  Administered 2016-12-01 – 2016-12-03 (×3): 40 mg via SUBCUTANEOUS
  Filled 2016-11-30 (×3): qty 0.4

## 2016-11-30 MED ORDER — CEFAZOLIN SODIUM-DEXTROSE 2-4 GM/100ML-% IV SOLN
INTRAVENOUS | Status: AC
  Filled 2016-11-30: qty 100

## 2016-11-30 MED ORDER — PROMETHAZINE HCL 25 MG/ML IJ SOLN: 6.2500 mg | INTRAMUSCULAR | Status: DC | PRN

## 2016-11-30 MED ORDER — FENTANYL CITRATE (PF) 250 MCG/5ML IJ SOLN
INTRAMUSCULAR | Status: AC
  Filled 2016-11-30: qty 5

## 2016-11-30 MED ORDER — CHLORHEXIDINE GLUCONATE CLOTH 2 % EX PADS: 6.0000 | MEDICATED_PAD | Freq: Once | CUTANEOUS | Status: DC

## 2016-11-30 MED ORDER — PROMETHAZINE HCL 25 MG/ML IJ SOLN
6.2500 mg | INTRAMUSCULAR | Status: DC | PRN
  Administered 2016-11-30: 6.25 mg via INTRAVENOUS

## 2016-11-30 MED ORDER — LIDOCAINE 2% (20 MG/ML) 5 ML SYRINGE
INTRAMUSCULAR | Status: AC
  Filled 2016-11-30: qty 5

## 2016-11-30 MED ORDER — PANTOPRAZOLE SODIUM 40 MG PO TBEC
40.0000 mg | DELAYED_RELEASE_TABLET | Freq: Two times a day (BID) | ORAL | Status: DC
  Administered 2016-12-01 – 2016-12-03 (×5): 40 mg via ORAL
  Filled 2016-11-30 (×5): qty 1

## 2016-11-30 MED ORDER — ACETAMINOPHEN 10 MG/ML IV SOLN
1000.0000 mg | Freq: Once | INTRAVENOUS | Status: AC
  Administered 2016-11-30: 1000 mg via INTRAVENOUS

## 2016-11-30 MED ORDER — MORPHINE SULFATE (PF) 4 MG/ML IV SOLN
2.0000 mg | INTRAVENOUS | Status: DC | PRN
  Administered 2016-11-30 (×3): 2 mg via INTRAVENOUS
  Filled 2016-11-30 (×6): qty 1

## 2016-11-30 MED ORDER — CEFAZOLIN SODIUM-DEXTROSE 2-4 GM/100ML-% IV SOLN
2.0000 g | INTRAVENOUS | Status: AC
  Administered 2016-11-30: 2 g via INTRAVENOUS

## 2016-11-30 MED ORDER — LACTATED RINGERS IV SOLN
INTRAVENOUS | Status: DC
  Administered 2016-11-30 – 2016-12-01 (×2): via INTRAVENOUS

## 2016-11-30 MED ORDER — ONDANSETRON 4 MG PO TBDP: 4.0000 mg | ORAL_TABLET | Freq: Four times a day (QID) | ORAL | Status: DC | PRN

## 2016-11-30 MED ORDER — ONDANSETRON HCL 4 MG/2ML IJ SOLN
INTRAMUSCULAR | Status: AC
  Filled 2016-11-30: qty 2

## 2016-11-30 MED ORDER — MIDAZOLAM HCL 2 MG/2ML IJ SOLN
INTRAMUSCULAR | Status: DC | PRN
  Administered 2016-11-30: 1 mg via INTRAVENOUS

## 2016-11-30 MED ORDER — PROPOFOL 10 MG/ML IV BOLUS
INTRAVENOUS | Status: AC
  Filled 2016-11-30: qty 20

## 2016-11-30 MED ORDER — ROCURONIUM BROMIDE 10 MG/ML (PF) SYRINGE
PREFILLED_SYRINGE | INTRAVENOUS | Status: DC | PRN
  Administered 2016-11-30: 40 mg via INTRAVENOUS
  Administered 2016-11-30: 10 mg via INTRAVENOUS

## 2016-11-30 MED ORDER — DOXYLAMINE SUCCINATE (SLEEP) 25 MG PO TABS
25.0000 mg | ORAL_TABLET | Freq: Every evening | ORAL | Status: DC | PRN
  Filled 2016-11-30: qty 1

## 2016-11-30 MED ORDER — DEXAMETHASONE SODIUM PHOSPHATE 10 MG/ML IJ SOLN
INTRAMUSCULAR | Status: DC | PRN
  Administered 2016-11-30: 5 mg via INTRAVENOUS

## 2016-11-30 MED ORDER — ACETAMINOPHEN 10 MG/ML IV SOLN
INTRAVENOUS | Status: AC
  Administered 2016-11-30: 1000 mg via INTRAVENOUS
  Filled 2016-11-30: qty 100

## 2016-11-30 MED ORDER — ONDANSETRON 4 MG PO TBDP: 4.0000 mg | ORAL_TABLET | ORAL | Status: DC | PRN

## 2016-11-30 MED ORDER — SUGAMMADEX SODIUM 200 MG/2ML IV SOLN
INTRAVENOUS | Status: AC
Start: 2016-11-30 — End: 2016-11-30
  Filled 2016-11-30: qty 2

## 2016-11-30 MED ORDER — FENTANYL CITRATE (PF) 100 MCG/2ML IJ SOLN
INTRAMUSCULAR | Status: DC | PRN
  Administered 2016-11-30: 100 ug via INTRAVENOUS
  Administered 2016-11-30 (×3): 50 ug via INTRAVENOUS

## 2016-11-30 MED ORDER — SCOPOLAMINE 1 MG/3DAYS TD PT72
1.0000 | MEDICATED_PATCH | TRANSDERMAL | Status: DC
  Administered 2016-11-30: 1.5 mg via TRANSDERMAL
  Filled 2016-11-30: qty 1

## 2016-11-30 MED ORDER — OXYCODONE HCL 5 MG PO TABS
5.0000 mg | ORAL_TABLET | Freq: Four times a day (QID) | ORAL | Status: DC | PRN
  Administered 2016-12-01 – 2016-12-03 (×6): 5 mg via ORAL
  Filled 2016-11-30 (×6): qty 1

## 2016-11-30 MED ORDER — LACTATED RINGERS IV SOLN
INTRAVENOUS | Status: DC | PRN
  Administered 2016-11-30: 07:00:00 via INTRAVENOUS

## 2016-11-30 MED ORDER — PROPOFOL 10 MG/ML IV BOLUS
INTRAVENOUS | Status: AC
  Filled 2016-11-30: qty 60

## 2016-11-30 MED ORDER — SUCCINYLCHOLINE CHLORIDE 200 MG/10ML IV SOSY
PREFILLED_SYRINGE | INTRAVENOUS | Status: AC
  Filled 2016-11-30: qty 10

## 2016-11-30 MED ORDER — HYDROMORPHONE HCL 1 MG/ML IJ SOLN: 0.2500 mg | INTRAMUSCULAR | Status: DC | PRN

## 2016-11-30 MED ORDER — LIDOCAINE 2% (20 MG/ML) 5 ML SYRINGE
INTRAMUSCULAR | Status: DC | PRN
  Administered 2016-11-30: 70 mg via INTRAVENOUS

## 2016-11-30 MED ORDER — ONDANSETRON HCL 4 MG/2ML IJ SOLN: 4.0000 mg | Freq: Four times a day (QID) | INTRAMUSCULAR | Status: DC | PRN

## 2016-11-30 MED ORDER — PROMETHAZINE HCL 25 MG/ML IJ SOLN
INTRAMUSCULAR | Status: AC
  Filled 2016-11-30: qty 1

## 2016-11-30 MED ORDER — BUPIVACAINE HCL (PF) 0.25 % IJ SOLN
INTRAMUSCULAR | Status: AC
  Filled 2016-11-30: qty 30

## 2016-11-30 MED ORDER — BUPIVACAINE HCL (PF) 0.25 % IJ SOLN
INTRAMUSCULAR | Status: DC | PRN
  Administered 2016-11-30: 25 mL

## 2016-11-30 MED ORDER — 0.9 % SODIUM CHLORIDE (POUR BTL) OPTIME
TOPICAL | Status: DC | PRN
  Administered 2016-11-30: 1000 mL

## 2016-11-30 MED ORDER — ONDANSETRON HCL 4 MG/2ML IJ SOLN
INTRAMUSCULAR | Status: AC
  Administered 2016-11-30: 4 mg via INTRAVENOUS
  Filled 2016-11-30: qty 2

## 2016-11-30 MED ORDER — ONDANSETRON HCL 4 MG/2ML IJ SOLN
4.0000 mg | Freq: Once | INTRAMUSCULAR | Status: AC
  Administered 2016-11-30: 4 mg via INTRAVENOUS

## 2016-11-30 MED ORDER — ONDANSETRON HCL 4 MG/2ML IJ SOLN
INTRAMUSCULAR | Status: DC | PRN
  Administered 2016-11-30: 4 mg via INTRAVENOUS

## 2016-11-30 MED ORDER — PROPOFOL 10 MG/ML IV BOLUS
INTRAVENOUS | Status: DC | PRN
  Administered 2016-11-30: 20 mg via INTRAVENOUS
  Administered 2016-11-30: 100 mg via INTRAVENOUS
  Administered 2016-11-30: 50 mg via INTRAVENOUS
  Administered 2016-11-30: 20 mg via INTRAVENOUS

## 2016-11-30 MED ORDER — ROCURONIUM BROMIDE 50 MG/5ML IV SOSY
PREFILLED_SYRINGE | INTRAVENOUS | Status: AC
  Filled 2016-11-30: qty 5

## 2016-11-30 MED ORDER — FENTANYL CITRATE (PF) 100 MCG/2ML IJ SOLN
25.0000 ug | INTRAMUSCULAR | Status: DC | PRN
  Administered 2016-11-30 (×2): 25 ug via INTRAVENOUS

## 2016-11-30 MED ORDER — ACETAMINOPHEN 500 MG PO TABS
1000.0000 mg | ORAL_TABLET | Freq: Four times a day (QID) | ORAL | Status: DC | PRN
  Administered 2016-12-01 – 2016-12-03 (×6): 1000 mg via ORAL
  Filled 2016-11-30 (×6): qty 2

## 2016-11-30 MED ORDER — SUCCINYLCHOLINE CHLORIDE 200 MG/10ML IV SOSY
PREFILLED_SYRINGE | INTRAVENOUS | Status: DC | PRN
  Administered 2016-11-30: 80 mg via INTRAVENOUS

## 2016-11-30 MED ORDER — PROPOFOL 500 MG/50ML IV EMUL
INTRAVENOUS | Status: DC | PRN
  Administered 2016-11-30: 120 ug/kg/min via INTRAVENOUS

## 2016-11-30 MED ORDER — DEXAMETHASONE SODIUM PHOSPHATE 10 MG/ML IJ SOLN
INTRAMUSCULAR | Status: AC
  Filled 2016-11-30: qty 1

## 2016-11-30 MED ORDER — OXYCODONE HCL 5 MG PO TABS: 5.0000 mg | ORAL_TABLET | Freq: Four times a day (QID) | ORAL | 0 refills | Status: DC | PRN

## 2016-11-30 MED ORDER — SODIUM CHLORIDE 0.9 % IJ SOLN
INTRAMUSCULAR | Status: AC
  Filled 2016-11-30: qty 10

## 2016-11-30 MED ORDER — FENTANYL CITRATE (PF) 100 MCG/2ML IJ SOLN
INTRAMUSCULAR | Status: AC
  Administered 2016-11-30: 25 ug via INTRAVENOUS
  Filled 2016-11-30: qty 2

## 2016-11-30 MED ORDER — BUPIVACAINE LIPOSOME 1.3 % IJ SUSP
20.0000 mL | Freq: Once | INTRAMUSCULAR | Status: DC
  Filled 2016-11-30: qty 20

## 2016-11-30 SURGICAL SUPPLY — 39 items
ADH SKN CLS APL DERMABOND .7 (GAUZE/BANDAGES/DRESSINGS) ×1
CLIP SUT LAPRA TY ABSORB (SUTURE) IMPLANT
DECANTER SPIKE VIAL GLASS SM (MISCELLANEOUS) ×6 IMPLANT
DERMABOND ADVANCED (GAUZE/BANDAGES/DRESSINGS) ×2
DERMABOND ADVANCED .7 DNX12 (GAUZE/BANDAGES/DRESSINGS) IMPLANT
DEVICE SUTURE ENDOST 10MM (ENDOMECHANICALS) IMPLANT
DISSECTOR BLUNT TIP ENDO 5MM (MISCELLANEOUS) IMPLANT
DRAPE UTILITY XL STRL (DRAPES) ×3 IMPLANT
ELECT REM PT RETURN 15FT ADLT (MISCELLANEOUS) ×3 IMPLANT
GAUZE SPONGE 4X4 12PLY STRL (GAUZE/BANDAGES/DRESSINGS) ×3 IMPLANT
GLOVE BIO SURGEON STRL SZ7.5 (GLOVE) ×3 IMPLANT
GLOVE BIOGEL PI IND STRL 7.0 (GLOVE) IMPLANT
GLOVE BIOGEL PI INDICATOR 7.0 (GLOVE)
GLOVE ECLIPSE 7.5 STRL STRAW (GLOVE) ×3 IMPLANT
GOWN STRL REUS W/TWL LRG LVL3 (GOWN DISPOSABLE) ×3 IMPLANT
GOWN STRL REUS W/TWL XL LVL3 (GOWN DISPOSABLE) ×6 IMPLANT
KIT BASIN OR (CUSTOM PROCEDURE TRAY) ×3 IMPLANT
NDL SPNL 22GX3.5 QUINCKE BK (NEEDLE) IMPLANT
NEEDLE SPNL 22GX3.5 QUINCKE BK (NEEDLE) IMPLANT
PAD POSITIONING PINK XL (MISCELLANEOUS) IMPLANT
POSITIONER SURGICAL ARM (MISCELLANEOUS) IMPLANT
RELOAD ENDO STITCH 2.0 (ENDOMECHANICALS)
RELOAD SUT SNGL STCH BLK 2-0 (ENDOMECHANICALS) IMPLANT
SHEARS HARMONIC ACE PLUS 36CM (ENDOMECHANICALS) ×2 IMPLANT
SLEEVE XCEL OPT CAN 5 100 (ENDOMECHANICALS) IMPLANT
SOLUTION ANTI FOG 6CC (MISCELLANEOUS) ×3 IMPLANT
STAPLER VISISTAT 35W (STAPLE) ×3 IMPLANT
SUT MNCRL AB 4-0 PS2 18 (SUTURE) ×3 IMPLANT
SUT RELOAD ENDO STITCH 2.0 (ENDOMECHANICALS)
SUT SILK 2 0 SH (SUTURE) ×4 IMPLANT
SUTURE RELOAD ENDO STITCH 2.0 (ENDOMECHANICALS) IMPLANT
TAPE CLOTH 4X10 WHT NS (GAUZE/BANDAGES/DRESSINGS) IMPLANT
TOWEL OR 17X26 10 PK STRL BLUE (TOWEL DISPOSABLE) ×3 IMPLANT
TOWEL OR NON WOVEN STRL DISP B (DISPOSABLE) ×3 IMPLANT
TRAY LAPAROSCOPIC (CUSTOM PROCEDURE TRAY) ×3 IMPLANT
TROCAR BLADELESS OPT 5 100 (ENDOMECHANICALS) ×3 IMPLANT
TROCAR HASSON 5MM (TROCAR) ×2 IMPLANT
TROCAR XCEL NON-BLD 11X100MML (ENDOMECHANICALS) ×2 IMPLANT
TUBING INSUF HEATED (TUBING) ×3 IMPLANT

## 2016-11-30 NOTE — Anesthesia Postprocedure Evaluation (Addendum)
Anesthesia Post Note  Patient: Kristen Delacruz  Procedure(s) Performed: Procedure(s) (LRB): LAPAROSCOPIC LYSIS OF ADHESIONS (N/A) UPPER ENDOSCOPY (N/A)  Patient location during evaluation: PACU Anesthesia Type: General Level of consciousness: awake and alert Pain management: pain level controlled Vital Signs Assessment: post-procedure vital signs reviewed and stable Respiratory status: spontaneous breathing, nonlabored ventilation, respiratory function stable and patient connected to nasal cannula oxygen Cardiovascular status: blood pressure returned to baseline and stable Postop Assessment: no signs of nausea or vomiting Anesthetic complications: no       Last Vitals:  Vitals:   11/30/16 1045 11/30/16 1049  BP: 112/69   Pulse: (!) 58 60  Resp: 15 (!) 6  Temp:      Last Pain:  Vitals:   11/30/16 0945  TempSrc:   PainSc: Asleep                 Ryanne Morand S

## 2016-11-30 NOTE — Interval H&P Note (Signed)
History and Physical Interval Note:  11/30/2016 7:16 AM  Kristen Delacruz  has presented today for surgery, with the diagnosis of Abdominal pain, post Roux-en-Y gastric bypass  The various methods of treatment have been discussed with the patient and family. After consideration of risks, benefits and other options for treatment, the patient has consented to  Procedure(s): LAPAROSCOPIC AND POSSIBLE INTERNAL HERNIA REPAIR (N/A) ESOPHAGOGASTRODUODENOSCOPY (EGD) WITH DILATATION (N/A) as a surgical intervention .  The patient's history has been reviewed, patient examined, no change in status, stable for surgery.  I have reviewed the patient's chart and labs.  Questions were answered to the patient's satisfaction.     Laney Bagshaw T

## 2016-11-30 NOTE — Op Note (Signed)
Preoperative Diagnosis: Abdominal pain, post Roux-en-Y gastric bypass  Postoprative Diagnosis: Abdominal pain, post Roux-en-Y gastric bypass  Procedure: Procedure(s): LAPAROSCOPIC LYSIS OF ADHESIONS UPPER ENDOSCOPY   Surgeon: Excell Seltzer T   Assistants: Alphonsa Overall  Anesthesia:  General endotracheal anesthesia  Indications: Patient is a 51 year old female several years post laparoscopic Roux-en-Y gastric bypass who has previously had difficulty with oral intake due to possible small marginal ulcer and anastomotic stricture which has responded well to dilatation in the past. She now presents with 5 episodes of severe crampy upper midabdominal pain lasting 15-20 minutes without associated symptoms occurring over the past 6 months. None in the last 6 weeks. CT scan was negative and she is post cholecystectomy. Due to recurrent pain I have recommended diagnostic laparoscopy to rule out internal hernia. She also feels some increased restriction and we plan upper endoscopy by Dr. Lucia Gaskins under the same anesthesia with possible dilatation under laparoscopic visualization. The procedure and indications and risks and alternatives have been discussed with the patient extensively and detailed elsewhere.    Procedure Detail:  Patient was brought to the operating room, placed in the supine position on the operating table, and general endotracheal anesthesia induced. She received preoperative IV antibiotics. The abdomen was widely sterilely prepped and draped. PAS replaced. Patient timeout was performed and correct procedure verified. Access was obtained with a very small infraumbilical incision with an open 5 mm balloon Hassan trocar without difficulty and pneumoperitoneum established. Under direct vision I placed a 5 mm trocar in the right lateral upper abdomen and a previous trocar site and a 5 mm trocar was placed in the right lower lateral abdomen. Careful diagnostic laparoscopy was then performed.  There was an omental adhesion up to the falciform ligament was taken down with cautery scissors. The gastrojejunostomy was visualized and appeared normal without any external evidence of ulceration or inflammatory change or stricture. The Roux limb was traced distally. Behind the Roux limb mesentery along the transverse mesocolon there was a small pocket or opening that tracked underneath the Roux mesentery but did not go completely freely to the left side of the abdomen and did not appear to be a significant internal hernia but I elected to close this. The small defect was closed with a figure-of-eight silk suture with an SH needle closing the Roux limb mesentery to the transverse colon mesentery after exchanging the lower right abdominal trocar for an 11 mm trocar. There was certainly no evidence of any hernia following this point and again I don't think this represented a true hernia. The bowel was traced distally to the jejunojejunostomy. This appeared normal. Careful examination of the mesentery showed a completely intact mesenteric closure. The a fair limb was examined back up to the ligament of Treitz and appeared normal. We then traced the common channel distally to the ileocecal valve which all appeared normal and without adhesions. There were some adhesions of the sigmoid colon down in the pelvis possibly from previous hysterectomy and no inflammatory changes.  With the small bowel clamped just distal to the gastrojejunostomy Dr. Lucia Gaskins performed upper endoscopy which he dictated separately but briefly showed a normal pouch and actually a widely patent approximately 2 cm gastrojejunostomy without ulceration or stricture and we did not feel that dilatation was indicated as this actually looked significantly better than at her last endoscopy. Following this the abdomen was carefully inspected for bleeding or injury and everything looked fine. Trochars were removed and CO2 evacuated. The small fascial  defect at  the umbilicus was closed with a single 0 Vicryl suture. Skin incisions were closed with subcuticular Monocryl and Dermabond. Sponge needle and instrument counts were correct.   Findings: See above  Estimated Blood Loss:  Minimal         Drains: None  Blood Given: none          Specimens: None        Complications:  * No complications entered in OR log *         Disposition: PACU - hemodynamically stable.         Condition: stable

## 2016-11-30 NOTE — Discharge Instructions (Signed)
[facility] ______[facility] °LAPAROSCOPIC SURGERY: POST OP INSTRUCTIONS °Always review your discharge instruction sheet given to you by the facility where your surgery was performed. °IF YOU HAVE DISABILITY OR FAMILY LEAVE FORMS, YOU MUST BRING THEM TO THE OFFICE FOR PROCESSING.   °DO NOT GIVE THEM TO YOUR DOCTOR. ° °1. A prescription for pain medication may be given to you upon discharge.  Take your pain medication as prescribed, if needed.  If narcotic pain medicine is not needed, then you may take acetaminophen (Tylenol) or ibuprofen (Advil) as needed. °2. Take your usually prescribed medications unless otherwise directed. °3. If you need a refill on your pain medication, please contact your pharmacy.  They will contact our office to request authorization. Prescriptions will not be filled after 5pm or on week-ends. °4. You should follow a light diet the first few days after arrival home, such as soup and crackers, etc.  Be sure to include lots of fluids daily. °5. Most patients will experience some swelling and bruising in the area of the incisions.  Ice packs will help.  Swelling and bruising can take several days to resolve.  °6. It is common to experience some constipation if taking pain medication after surgery.  Increasing fluid intake and taking a stool softener (such as Colace) will usually help or prevent this problem from occurring.  A mild laxative (Milk of Magnesia or Miralax) should be taken according to package instructions if there are no bowel movements after 48 hours. °7. Unless discharge instructions indicate otherwise, you may remove your bandages 24-48 hours after surgery, and you may shower at that time.  You may have steri-strips (small skin tapes) in place directly over the incision.  These strips should be left on the skin for 7-10 days.  If your surgeon used skin glue on the incision, you may shower in 24 hours.  The glue will flake off over the next 2-3 weeks.  Any sutures or  staples will be removed at the office during your follow-up visit. °8. ACTIVITIES:  You may resume regular (light) daily activities beginning the next day--such as daily self-care, walking, climbing stairs--gradually increasing activities as tolerated.  You may have sexual intercourse when it is comfortable.  Refrain from any heavy lifting or straining until approved by your doctor. °a. You may drive when you are no longer taking prescription pain medication, you can comfortably wear a seatbelt, and you can safely maneuver your car and apply brakes. °b. RETURN TO WORK:  __________________________________________________________ °9. You should see your doctor in the office for a follow-up appointment approximately 2-3 weeks after your surgery.  Make sure that you call for this appointment within a day or two after you arrive home to insure a convenient appointment time. °10. OTHER INSTRUCTIONS: __________________________________________________________________________________________________________________________ __________________________________________________________________________________________________________________________ °WHEN TO CALL YOUR DOCTOR: °1. Fever over 101.0 °2. Inability to urinate °3. Continued bleeding from incision. °4. Increased pain, redness, or drainage from the incision. °5. Increasing abdominal pain ° °The clinic staff is available to answer your questions during regular business hours.  Please don’t hesitate to call and ask to speak to one of the nurses for clinical concerns.  If you have a medical emergency, go to the nearest emergency room or call 911.  A surgeon from [facility] is always on call at the hospital. °[address], [address], [place], [place]  [address] ? [address] [address], [place], [place]   [address] °[phone] ? [phone] ? FAX [phone] °Web site:   [email or web address] °

## 2016-11-30 NOTE — Op Note (Signed)
11/30/2016  10:27 AM  PATIENT:  Kristen Delacruz, 51 y.o., female, MRN: [id number]  PREOP DIAGNOSIS:  Abdominal pain  POSTOP DIAGNOSIS:   Normal roux en y gastric bypass anatomy  PROCEDURE:  Esophagogastrojejunoscopy  [photo at end of note]  SURGEON:   Alphonsa Overall, M.D.  ANESTHESIA:   General  INDICATIONS FOR PROCEDURE:  [name] is a 51 y.o. (DOB: 06-10-1966)  white female whose primary care physician is Manfred Shirts, [place].  The patient had a RYGB on 11/25/2014 by Dr. Excell Seltzer.   Dr. Excell Seltzer is doing a laparoscopy to look for why Ms. Ramnath continues to have abdominal pain.  She has had about 5 "attacks" in the last couple of months.  He is doing laparoscopy looking for an internal hernia.   After his laparoscopy, I am doing an upper endoscopy to evaluate her gastric pouch.  She had a marginal ulcer early after surgery and I had dilated her anastomosis twice to 15 mm - once on 02/02/2016 and once on 09/20/2016  PROCEDURE:  The patient was in [facility] room #1.  Dr. Excell Seltzer has completed the laparoscopic portion of the operation.  He is manning the laparoscope and has clamped off the jejunal limb from the gastric pouch..  The patient is under general anesthesia.  Findings include:   Esophagus:   Normal   GE junction at:  34 cm   Stomach pouch: Normal   Gastrojejunal anastomosis:   40 cm.  The [place] is more widely open than when I last endoscoped her.  I estimated the [place] opening at 20 mm - the scope was easily passed through the anastomosis.  There was nothing to dilate.   Efferent jejunal limb:  Normal to 10 cm Afferent jejunal limb:  Normal to 10 cm   CLO test:  Not done.  PLAN:   Photos taken and given to patient.    Dr. Excell Seltzer was present during the upper endoscopy.    Gastrojejunal anastomosis  Alphonsa Overall, MD, [facility] Pager: [phone] Office phone:  [phone]

## 2016-11-30 NOTE — Anesthesia Procedure Notes (Signed)
Procedure Name: Intubation Date/Time: 11/30/2016 7:32 AM Performed by: Danley Danker L Patient Re-evaluated:Patient Re-evaluated prior to inductionOxygen Delivery Method: Circle system utilized Preoxygenation: Pre-oxygenation with 100% oxygen Intubation Type: IV induction, Rapid sequence and Cricoid Pressure applied Laryngoscope Size: Mac and 3 Grade View: Grade I Tube type: Oral Tube size: 7.5 mm Number of attempts: 1 Airway Equipment and Method: Stylet Placement Confirmation: ETT inserted through vocal cords under direct vision,  positive ETCO2 and breath sounds checked- equal and bilateral Secured at: 24 cm Tube secured with: Tape Dental Injury: Teeth and Oropharynx as per pre-operative assessment

## 2016-11-30 NOTE — Transfer of Care (Signed)
Immediate Anesthesia Transfer of Care Note  Patient: Kristen Delacruz  Procedure(s) Performed: Procedure(s): LAPAROSCOPIC LYSIS OF ADHESIONS (N/A) UPPER ENDOSCOPY (N/A)  Patient Location: PACU  Anesthesia Type:General  Level of Consciousness: awake  Airway & Oxygen Therapy: Patient Spontanous Breathing and Patient connected to face mask oxygen  Post-op Assessment: Report given to RN and Post -op Vital signs reviewed and stable  Post vital signs: Reviewed and stable  Last Vitals:  Vitals:   11/30/16 0538 11/30/16 0910  BP: 102/66   Pulse: 66   Resp: 16 12  Temp: 36.7 C 36.4 C    Last Pain:  Vitals:   11/30/16 0538  TempSrc: Oral      Patients Stated Pain Goal: 4 (25/74/93 5521)  Complications: No apparent anesthesia complications

## 2016-11-30 NOTE — Anesthesia Preprocedure Evaluation (Signed)
Anesthesia Evaluation  Patient identified by MRN, date of birth, ID band Patient awake    Reviewed: Allergy & Precautions, NPO status , Patient's Chart, lab work & pertinent test results  Airway Mallampati: II  TM Distance: >3 FB Neck ROM: Full    Dental no notable dental hx.    Pulmonary sleep apnea ,    Pulmonary exam normal breath sounds clear to auscultation       Cardiovascular negative cardio ROS Normal cardiovascular exam Rhythm:Regular Rate:Normal     Neuro/Psych negative neurological ROS  negative psych ROS   GI/Hepatic Neg liver ROS, GERD  ,  Endo/Other  negative endocrine ROS  Renal/GU negative Renal ROS  negative genitourinary   Musculoskeletal negative musculoskeletal ROS (+)   Abdominal   Peds negative pediatric ROS (+)  Hematology negative hematology ROS (+)   Anesthesia Other Findings   Reproductive/Obstetrics negative OB ROS                            Anesthesia Physical Anesthesia Plan  ASA: II  Anesthesia Plan: General   Post-op Pain Management:    Induction: Intravenous  Airway Management Planned: Oral ETT  Additional Equipment:   Intra-op Plan:   Post-operative Plan: Extubation in OR  Informed Consent: I have reviewed the patients History and Physical, chart, labs and discussed the procedure including the risks, benefits and alternatives for the proposed anesthesia with the patient or authorized representative who has indicated his/her understanding and acceptance.   Dental advisory given  Plan Discussed with: CRNA and Surgeon  Anesthesia Plan Comments:         Anesthesia Quick Evaluation

## 2016-12-01 ENCOUNTER — Encounter (HOSPITAL_COMMUNITY): Payer: Self-pay | Admitting: General Surgery

## 2016-12-01 ENCOUNTER — Observation Stay (HOSPITAL_COMMUNITY): Payer: BLUE CROSS/BLUE SHIELD

## 2016-12-01 DIAGNOSIS — R0789 Other chest pain: Secondary | ICD-10-CM | POA: Diagnosis not present

## 2016-12-01 LAB — TROPONIN I: Troponin I: <0.03 ng/mL (ref <0.03)

## 2016-12-01 LAB — CBC
HCT: 34.9 % — ABNORMAL LOW (ref 36.0–46.0)
Hemoglobin: 11.7 g/dL — ABNORMAL LOW (ref 12.0–15.0)
MCH: 31.6 pg (ref 26.0–34.0)
MCHC: 33.5 g/dL (ref 30.0–36.0)
MCV: 94.3 fL (ref 78.0–100.0)
Platelets: 182 10*3/uL (ref 150–400)
RBC: 3.7 MIL/uL — ABNORMAL LOW (ref 3.87–5.11)
RDW: 12.7 % (ref 11.5–15.5)
WBC: 8.1 10*3/uL (ref 4.0–10.5)

## 2016-12-01 LAB — TSH: TSH: 1.434 u[IU]/mL (ref 0.350–4.500)

## 2016-12-01 LAB — LACTIC ACID, PLASMA
Lactic Acid, Venous: 0.9 mmol/L (ref 0.5–1.9)
Lactic Acid, Venous: 1 mmol/L (ref 0.5–1.9)

## 2016-12-01 LAB — CORTISOL: CORTISOL PLASMA: 0.6 ug/dL

## 2016-12-01 MED ORDER — MIDODRINE HCL 2.5 MG PO TABS
2.5000 mg | ORAL_TABLET | Freq: Two times a day (BID) | ORAL | Status: DC
  Administered 2016-12-01: 20:00:00 2.5 mg via ORAL
  Filled 2016-12-01 (×2): qty 1

## 2016-12-01 MED ORDER — TRAMADOL HCL 50 MG PO TABS
50.0000 mg | ORAL_TABLET | Freq: Four times a day (QID) | ORAL | Status: DC | PRN
  Administered 2016-12-01: 50 mg via ORAL
  Filled 2016-12-01: qty 1

## 2016-12-01 MED ORDER — DIPHENHYDRAMINE HCL 25 MG PO CAPS
50.0000 mg | ORAL_CAPSULE | Freq: Four times a day (QID) | ORAL | Status: DC | PRN
  Administered 2016-12-01 – 2016-12-02 (×2): 50 mg via ORAL
  Filled 2016-12-01 (×2): qty 2

## 2016-12-01 MED ORDER — SODIUM CHLORIDE 0.9 % IV BOLUS (SEPSIS)
500.0000 mL | Freq: Once | INTRAVENOUS | Status: AC
  Administered 2016-12-01: 500 mL via INTRAVENOUS

## 2016-12-01 NOTE — Progress Notes (Signed)
Notified Dr. Excell Seltzer about pt BP of 84/42. Pt is asymptomatic. 500 cc NS bolus ordered. Will continue to monitor.
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
## 2016-12-01 NOTE — Progress Notes (Signed)
1 Day Post-Op   Subjective: Had a right sided abdominal and shoulder pain overnight which is significantly improved this morning. Nausea yesterday which has resolved. Blood pressure noted to be low overnight and she has had some dizziness when getting up. Voiding normal amounts.  Objective: Vital signs in last 24 hours: Temp:  [97.6 F (36.4 C)-98.4 F (36.9 C)] 98.2 F (36.8 C) (05/09 0531) Pulse Rate:  [52-75] 58 (05/09 0531) Resp:  [3-16] 15 (05/09 0531) BP: (81-112)/(40-72) 82/40 (05/09 0531) SpO2:  [98 %-100 %] 100 % (05/09 0531)    Intake/Output from previous day: 05/08 0701 - 05/09 0700 In: 2087.5 [P.O.:440; I.V.:1647.5] Out: 675 [Urine:650; Blood:25] Intake/Output this shift: No intake/output data recorded.  General appearance: alert, cooperative and no distress Resp: clear to auscultation bilaterally GI: Very minimal tenderness without guarding. No distention.  Incisions: Clean and dry  Lab Results:  No results for input(s): WBC, HGB, HCT, PLT in the last 72 hours. BMET No results for input(s): NA, K, CL, CO2, GLUCOSE, BUN, CREATININE, CALCIUM in the last 72 hours.   Studies/Results: No results found.  Anti-infectives: Anti-infectives    Start     Dose/Rate Route Frequency Ordered Stop   11/30/16 0654  ceFAZolin (ANCEF) 2-4 GM/100ML-% IVPB    Comments:  Danley Danker   : cabinet override      11/30/16 0654 11/30/16 0734   11/30/16 0551  ceFAZolin (ANCEF) IVPB 2g/100 mL premix     2 g 200 mL/hr over 30 Minutes Intravenous On call to O.R. 11/30/16 4098 11/30/16 0744      Assessment/Plan: s/p Procedure(s): LAPAROSCOPIC LYSIS OF ADHESIONS UPPER ENDOSCOPY Blood pressure low overnight. She generally runs a low blood pressure but not quite to this degree. She otherwise appears well on exam. Not tachycardic to suggest bleeding. I will check a CBC this morning. Oral intake encouraged and we will monitor this morning for possible discharge later today.   LOS: 0  days    Kristen Delacruz 5/9/2018Patient ID: Kristen Delacruz, female   DOB: 09-20-1965, 51 y.o.   MRN: [id number]

## 2016-12-02 DIAGNOSIS — E271 Primary adrenocortical insufficiency: Secondary | ICD-10-CM | POA: Diagnosis not present

## 2016-12-02 DIAGNOSIS — K660 Peritoneal adhesions (postprocedural) (postinfection): Secondary | ICD-10-CM | POA: Diagnosis present

## 2016-12-02 DIAGNOSIS — Z888 Allergy status to other drugs, medicaments and biological substances status: Secondary | ICD-10-CM | POA: Diagnosis not present

## 2016-12-02 DIAGNOSIS — Z79899 Other long term (current) drug therapy: Secondary | ICD-10-CM | POA: Diagnosis not present

## 2016-12-02 DIAGNOSIS — G4730 Sleep apnea, unspecified: Secondary | ICD-10-CM | POA: Diagnosis present

## 2016-12-02 DIAGNOSIS — K219 Gastro-esophageal reflux disease without esophagitis: Secondary | ICD-10-CM | POA: Diagnosis present

## 2016-12-02 DIAGNOSIS — Z681 Body mass index (BMI) 19 or less, adult: Secondary | ICD-10-CM | POA: Diagnosis not present

## 2016-12-02 DIAGNOSIS — Z90710 Acquired absence of both cervix and uterus: Secondary | ICD-10-CM | POA: Diagnosis not present

## 2016-12-02 DIAGNOSIS — I951 Orthostatic hypotension: Secondary | ICD-10-CM

## 2016-12-02 DIAGNOSIS — I959 Hypotension, unspecified: Secondary | ICD-10-CM

## 2016-12-02 DIAGNOSIS — R109 Unspecified abdominal pain: Secondary | ICD-10-CM | POA: Diagnosis present

## 2016-12-02 DIAGNOSIS — K458 Other specified abdominal hernia without obstruction or gangrene: Secondary | ICD-10-CM | POA: Diagnosis present

## 2016-12-02 DIAGNOSIS — R0789 Other chest pain: Secondary | ICD-10-CM

## 2016-12-02 DIAGNOSIS — I9589 Other hypotension: Secondary | ICD-10-CM | POA: Diagnosis not present

## 2016-12-02 DIAGNOSIS — I9581 Postprocedural hypotension: Secondary | ICD-10-CM | POA: Diagnosis not present

## 2016-12-02 DIAGNOSIS — Z9049 Acquired absence of other specified parts of digestive tract: Secondary | ICD-10-CM | POA: Diagnosis not present

## 2016-12-02 LAB — COMPREHENSIVE METABOLIC PANEL
ALK PHOS: 68 U/L (ref 38–126)
ALT: 82 U/L — AB (ref 14–54)
AST: 89 U/L — AB (ref 15–41)
Albumin: 3 g/dL — ABNORMAL LOW (ref 3.5–5.0)
Anion gap: 6 (ref 5–15)
BUN: 13 mg/dL (ref 6–20)
CHLORIDE: 102 mmol/L (ref 101–111)
CO2: 32 mmol/L (ref 22–32)
CREATININE: 0.59 mg/dL (ref 0.44–1.00)
Calcium: 8.4 mg/dL — ABNORMAL LOW (ref 8.9–10.3)
Glucose, Bld: 90 mg/dL (ref 65–99)
POTASSIUM: 3.9 mmol/L (ref 3.5–5.1)
Sodium: 140 mmol/L (ref 135–145)
Total Bilirubin: 0.5 mg/dL (ref 0.3–1.2)
Total Protein: 5.4 g/dL — ABNORMAL LOW (ref 6.5–8.1)

## 2016-12-02 LAB — CBC
HEMATOCRIT: 35.3 % — AB (ref 36.0–46.0)
HEMOGLOBIN: 11.9 g/dL — AB (ref 12.0–15.0)
MCH: 32.2 pg (ref 26.0–34.0)
MCHC: 33.7 g/dL (ref 30.0–36.0)
MCV: 95.4 fL (ref 78.0–100.0)
Platelets: 166 10*3/uL (ref 150–400)
RBC: 3.7 MIL/uL — AB (ref 3.87–5.11)
RDW: 12.9 % (ref 11.5–15.5)
WBC: 6.4 10*3/uL (ref 4.0–10.5)

## 2016-12-02 LAB — TROPONIN I

## 2016-12-02 MED ORDER — POTASSIUM CHLORIDE IN NACL 20-0.9 MEQ/L-% IV SOLN
INTRAVENOUS | Status: AC
  Administered 2016-12-02: 09:00:00 via INTRAVENOUS
  Filled 2016-12-02 (×2): qty 1000

## 2016-12-02 MED ORDER — SODIUM CHLORIDE 0.9 % IV BOLUS (SEPSIS)
500.0000 mL | Freq: Once | INTRAVENOUS | Status: AC
  Administered 2016-12-02: 500 mL via INTRAVENOUS

## 2016-12-02 MED ORDER — COSYNTROPIN 0.25 MG IJ SOLR
0.2500 mg | Freq: Once | INTRAMUSCULAR | Status: AC
  Administered 2016-12-03: 0.25 mg via INTRAVENOUS
  Filled 2016-12-02 (×2): qty 0.25

## 2016-12-02 MED ORDER — KCL IN DEXTROSE-NACL 20-5-0.9 MEQ/L-%-% IV SOLN
INTRAVENOUS | Status: DC
  Administered 2016-12-02: 18:00:00 via INTRAVENOUS
  Administered 2016-12-03: 125 mL/h via INTRAVENOUS
  Administered 2016-12-03: 03:00:00 via INTRAVENOUS
  Filled 2016-12-02 (×3): qty 1000

## 2016-12-02 MED ORDER — SODIUM CHLORIDE 0.9 % IV BOLUS (SEPSIS)
500.0000 mL | Freq: Once | INTRAVENOUS | Status: AC
  Administered 2016-12-02: 17:00:00 500 mL via INTRAVENOUS

## 2016-12-02 MED ORDER — SODIUM CHLORIDE 0.9 % IV SOLN: INTRAVENOUS | Status: DC

## 2016-12-02 NOTE — Progress Notes (Signed)
Patient ID: Kristen Delacruz, female   DOB: 1966-02-01, 51 y.o.   MRN: [id number] 2 Days Post-Op   Subjective: Has a headache particularly when standing or sitting. Denies any abdominal pain or nausea. Tolerating diet. Had flatus.  Objective: Vital signs in last 24 hours: Temp:  [98.2 F (36.8 C)-98.8 F (37.1 C)] 98.8 F (37.1 C) (05/10 0513) Pulse Rate:  [52-68] 57 (05/10 0620) Resp:  [14-15] 14 (05/10 0513) BP: (77-94)/(39-57) 81/51 (05/10 0620) SpO2:  [95 %-99 %] 95 % (05/10 0513) Last BM Date: 11/29/16  Intake/Output from previous day: 05/09 0701 - 05/10 0700 In: 1644.2 [P.O.:240; I.V.:1404.2] Out: 2400 [Urine:2400] Intake/Output this shift: No intake/output data recorded.  General appearance: alert, cooperative and no distress GI: normal findings: soft, non-tender and Nondistended Incision/Wound: Clean and dry  Lab Results:   Recent Labs  12/01/16 0736 12/02/16 0414  WBC 8.1 6.4  HGB 11.7* 11.9*  HCT 34.9* 35.3*  PLT 182 166   BMET  Recent Labs  12/02/16 0414  NA 140  K 3.9  CL 102  CO2 32  GLUCOSE 90  BUN 13  CREATININE 0.59  CALCIUM 8.4*     Studies/Results: Dg Chest 2 View  Result Date: 12/01/2016 CLINICAL DATA:  Status post laparoscopic lysis of adhesions and upper endoscopy EXAM: CHEST  2 VIEW COMPARISON:  09/12/2014 FINDINGS: Pneumoperitoneum noted. The heart size and mediastinal contours are within normal limits. Both lungs are clear. The visualized skeletal structures are unremarkable. IMPRESSION: 1. No acute cardiopulmonary abnormalities. 2. Pneumoperitoneum.  Compatible with recent surgery. Electronically Signed   By: Kerby Moors M.D.   On: 12/01/2016 14:28    Anti-infectives: Anti-infectives    Start     Dose/Rate Route Frequency Ordered Stop   11/30/16 0654  ceFAZolin (ANCEF) 2-4 GM/100ML-% IVPB    Comments:  Danley Danker   : cabinet override      11/30/16 0654 11/30/16 0734   11/30/16 0551  ceFAZolin (ANCEF) IVPB 2g/100 mL premix      2 g 200 mL/hr over 30 Minutes Intravenous On call to O.R. 11/30/16 4315 11/30/16 0744      Assessment/Plan: s/p Procedure(s): LAPAROSCOPIC LYSIS OF ADHESIONS UPPER ENDOSCOPY  Hypotension persistently postoperatively. Tends to run a low blood pressure preop but not quite slow. Appreciate medicine team input. Random cortisol was somewhat low and stimulation test planned. No specific surgical/GI issues currently.    Samuel Rittenhouse T 12/02/2016

## 2016-12-02 NOTE — Progress Notes (Signed)
PROGRESS NOTE  Kristen Delacruz OEH:[id number] DOB: 1966-06-09 DOA: 11/30/2016 PCP: Manfred Shirts, PA  Brief History:  51 year old female with a history of GERD, asthma, and gastric bypass on 11/25/14 was admitted on 11/30/2016 presented to Gen. surgery office with severe epigastric cramping of several weeks' duration that was not related to eating. Since her gastric bypass, the patient has had some issues with postoperative dysphagia and epigastric discomfort with solid foods. She has had a number of upper endoscopies which have revealed anastomotic stricture with a marginal ulcer. The stricture was dilated in July 2017 with improvement of her symptoms. More recently, the patient has an acute onset of severe epigastric pain not related to oral intake. She stated that the pain would resolve spontaneously after 15 minutes. CT scan of the abdomen and pelvis on 09/02/2016 was negative for any acute findings. Her abdominal pain recurred waking her up the patient from sleep approximately 1 week prior to this admission.  Due to her recurrent abdominal pain, the patient was admitted for diagnostic laparoscopy. The patient states that she has had overall decreased oral intake because of her abdominal pain but is able to tolerate solid foods. The patient has had a decline in her weight, but her weight has been stable over the past 2 months. On 11/30/2016, the patient underwent lysis of adhesions and upper endoscopy. The upper endoscopy showed a widely patent gastrojejunostomy without ulceration or stricture.  Postoperatively, the patient has had hypertension with systolic blood pressure in the 80-90s. As result, internal medicine was consulted for further assistance.  Assessment/Plan: Hypotension -This is likely multifactorial including decreased oral intake/volume depletion, fluid shifts, opioid medications, and possibly primary adrenal insufficiency -Random cortisol 0.6 -Check Cortrosyn stimulation  test -Continue IV fluids--change to NS with KCl -Review of the medical records shows that in the past 4 months, the patient's systolic blood pressures normally ranges 100-110 -lactic acid 1.0 -d/c midodrine -TSH 1.434  Atypical chest pain -EKG -CXR--personally reviewed, negative  -finish cycling troponins  S/p gastrojejunostomy -s/p lysis of adhesions -per general surgery     Disposition Plan:   Home 5/11 if stable Family Communication:   Spouse updated at bedside 12/02/16--Total time spent 35 minutes.  Greater than 50% spent face to face counseling and coordinating care.   Consultants:  General surgery  Code Status:  FULL   DVT Prophylaxis:  [place] Lovenox   Procedures: As Listed in Progress Note Above  Antibiotics: Perioperative cefazolin    Subjective: Patient plans of some abdominal pain controlled with opioids. Denies any vomiting, diarrhea, dysuria, hematuria. Denies any fevers, chills, shortness of breath. She has intermittent chest discomfort which she describes as gas-like pain which is relieved by lying on her left side. It is not affected by eating. She describes it as a gas type pain with some worsening with inspiration.  Objective: Vitals:   12/01/16 2204 12/02/16 0205 12/02/16 0513 12/02/16 0620  BP: (!) 90/57 (!) 94/45 (!) 79/39 (!) 81/51  Pulse: (!) 54 (!) 52 (!) 58 (!) 57  Resp:   14   Temp:   98.8 F (37.1 C)   TempSrc:   Oral   SpO2:   95%   Weight:      Height:        Intake/Output Summary (Last 24 hours) at 12/02/16 0726 Last data filed at 12/02/16 0370  Gross per 24 hour  Intake  1644.17 ml  Output             2400 ml  Net          -755.83 ml   Weight change:  Exam:   General:  Pt is alert, follows commands appropriately, not in acute distress  HEENT: No icterus, No thrush, No neck mass, [place]/AT  Cardiovascular: RRR, S1/S2, no rubs, no gallops  Respiratory: CTA bilaterally, no wheezing, no crackles, no rhonchi  Abdomen:  Soft/+BS, non tender, non distended, no guarding  Extremities: trace LE edema, No lymphangitis, No petechiae, No rashes, no synovitis   Data Reviewed: I have personally reviewed following labs and imaging studies Basic Metabolic Panel:  Recent Labs Lab 12/02/16 0414  NA 140  K 3.9  CL 102  CO2 32  GLUCOSE 90  BUN 13  CREATININE 0.59  CALCIUM 8.4*   Liver Function Tests:  Recent Labs Lab 12/02/16 0414  AST 89*  ALT 82*  ALKPHOS 68  BILITOT 0.5  PROT 5.4*  ALBUMIN 3.0*   No results for input(s): LIPASE, AMYLASE in the last 168 hours. No results for input(s): AMMONIA in the last 168 hours. Coagulation Profile: No results for input(s): INR, PROTIME in the last 168 hours. CBC:  Recent Labs Lab 12/01/16 0736 12/02/16 0414  WBC 8.1 6.4  HGB 11.7* 11.9*  HCT 34.9* 35.3*  MCV 94.3 95.4  PLT 182 166   Cardiac Enzymes:  Recent Labs Lab 12/01/16 1212  TROPONINI <0.03   BNP: Invalid input(s): POCBNP CBG: No results for input(s): GLUCAP in the last 168 hours. HbA1C: No results for input(s): HGBA1C in the last 72 hours. Urine analysis: No results found for: COLORURINE, APPEARANCEUR, LABSPEC, PHURINE, GLUCOSEU, HGBUR, BILIRUBINUR, KETONESUR, PROTEINUR, UROBILINOGEN, NITRITE, LEUKOCYTESUR Sepsis Labs: @LABRCNTIP (procalcitonin:4,lacticidven:4) )No results found for this or any previous visit (from the past 240 hour(s)).   Scheduled Meds: . enoxaparin (LOVENOX) injection  40 mg Subcutaneous Q24H  . midodrine  2.5 mg Oral BID WC  . pantoprazole  40 mg Oral BID WC  . scopolamine  1 patch Transdermal Q72H   Continuous Infusions: . lactated ringers 50 mL/hr at 12/01/16 1801    Procedures/Studies: Dg Chest 2 View  Result Date: 12/01/2016 CLINICAL DATA:  Status post laparoscopic lysis of adhesions and upper endoscopy EXAM: CHEST  2 VIEW COMPARISON:  09/12/2014 FINDINGS: Pneumoperitoneum noted. The heart size and mediastinal contours are within normal limits.  Both lungs are clear. The visualized skeletal structures are unremarkable. IMPRESSION: 1. No acute cardiopulmonary abnormalities. 2. Pneumoperitoneum.  Compatible with recent surgery. Electronically Signed   By: Kerby Moors M.D.   On: 12/01/2016 14:28    Savvy Peeters, DO  [facility] Pager [phone]  If 7PM-7AM, please contact night-coverage [email or web address] Password [email or web address] 12/02/2016, 7:26 AM   LOS: 0 days

## 2016-12-02 NOTE — Progress Notes (Signed)
2 Days Post-Op  Chief Complaint/Subjective:  Acute abdominal pain Pt says she had an episode a short time ago, acute pain in her epigastric area, lasting about 4 minutes.  It gets tight and squeezes, she never knows when it will happen,  and it "takes her breath away."  She is completley fine now, no pain.  Exam is normal.  BP is still low.  Does not feel like ulcer issue.  Objective: Vital signs in last 24 hours: Temp:  [98.7 F (37.1 C)-98.8 F (37.1 C)] 98.7 F (37.1 C) (05/10 1447) Pulse Rate:  [52-75] 75 (05/10 1447) Resp:  [14] 14 (05/10 1447) BP: (79-94)/(39-57) 88/48 (05/10 1447) SpO2:  [95 %-99 %] 99 % (05/10 1447) Last BM Date: 11/29/16 720 PO this AM  200 IV Urine 1100 this AM Negative 180 ml today  Hypotensive Labs OK this AM, Troponin x 3 are normal EKG ordered this AM not done. CXR yesterday:  No acute issues Intake/Output from previous day: 05/09 0701 - 05/10 0700 In: 1644.2 [P.O.:240; I.V.:1404.2] Out: 2400 [Urine:2400] Intake/Output this shift: Total I/O In: 920 [P.O.:720; I.V.:200] Out: 1100 [Urine:1100]  General appearance: alert, cooperative and no distress Resp: clear to auscultation bilaterally GI: soft, non-tender; bowel sounds normal; no masses,  no organomegaly  Lab Results:   Recent Labs  12/01/16 0736 12/02/16 0414  WBC 8.1 6.4  HGB 11.7* 11.9*  HCT 34.9* 35.3*  PLT 182 166    BMET  Recent Labs  12/02/16 0414  NA 140  K 3.9  CL 102  CO2 32  GLUCOSE 90  BUN 13  CREATININE 0.59  CALCIUM 8.4*   PT/INR No results for input(s): LABPROT, INR in the last 72 hours.   Recent Labs Lab 12/02/16 0414  AST 89*  ALT 82*  ALKPHOS 68  BILITOT 0.5  PROT 5.4*  ALBUMIN 3.0*     Lipase  No results found for: LIPASE   Prior to Admission medications   Medication Sig Start Date End Date Taking? Authorizing Provider  acetaminophen (TYLENOL) 500 MG tablet Take 1,000 mg by mouth every 6 (six) hours as needed for moderate pain or  headache.    Yes [provider]  Cyanocobalamin (B-12) 2500 MCG TABS Take 2,500 mcg by mouth daily.   Yes [provider]  Doxylamine Succinate, Sleep, (SLEEP AID PO) Take 1 tablet by mouth at bedtime as needed (sleep).   Yes [provider]  furosemide (LASIX) 40 MG tablet Take 40 mg by mouth daily as needed for fluid or edema.    Yes [provider]  ondansetron (ZOFRAN-ODT) 4 MG disintegrating tablet Take 1 tablet by mouth every 4 (four) hours as needed for nausea or vomiting.  12/26/15  Yes [provider]  pantoprazole (PROTONIX) 40 MG tablet Take 1 tablet (40 mg total) by mouth 2 (two) times daily. 03/25/15  Yes Alphonsa Overall, MD  Pediatric Multiple Vit-C-FA (FLINSTONES GUMMIES OMEGA-3 DHA) CHEW Chew 1 each by mouth every morning.   Yes [provider]  oxyCODONE (OXY IR/ROXICODONE) 5 MG immediate release tablet Take 1-2 tablets (5-10 mg total) by mouth every 6 (six) hours as needed for severe pain. 11/30/16   Excell Seltzer, MD    Medications: . [START ON 12/03/2016] cosyntropin  0.25 mg Intravenous Once  . enoxaparin (LOVENOX) injection  40 mg Subcutaneous Q24H  . pantoprazole  40 mg Oral BID WC  . scopolamine  1 patch Transdermal Q72H   . 0.9 % NaCl with KCl 20  mEq / L 100 mL/hr at 12/02/16 1000   Anti-infectives    Start     Dose/Rate Route Frequency Ordered Stop   11/30/16 0654  ceFAZolin (ANCEF) 2-4 GM/100ML-% IVPB    Comments:  Danley Danker   : cabinet override      11/30/16 0654 11/30/16 0734   11/30/16 0551  ceFAZolin (ANCEF) IVPB 2g/100 mL premix     2 g 200 mL/hr over 30 Minutes Intravenous On call to O.R. 11/30/16 7473 11/30/16 0744     . 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 Excell Seltzer FEN:  IV fluids/Regular diet ID:  preop abx DVT:  Lovenox   I don't see anything that would cause this.  She is on PPI BID already, she is completely asymptomatic now.  I am going to up her IV fluids and see if we can get her BP up.  She had a negative H Pylori.  09/20/16.         LOS: 0 days    Kristen Delacruz 12/02/2016 [phone]

## 2016-12-02 NOTE — Discharge Summary (Addendum)
Physician Discharge Summary  Kristen Delacruz:[id number] DOB: 1965-11-07 DOA: 11/30/2016  PCP: Manfred Shirts, PA  Admit date: 11/30/2016 Discharge date: 12/03/16  Admitted From: Home Disposition:  Home   Recommendations for Outpatient Follow-up:  1. Follow up with PCP in 1-2 weeks 2. 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 surgery office with severe epigastric cramping of several weeks' duration that was not related to eating. Since her gastric bypass, the patient has had some issues with postoperative dysphagia and epigastric discomfort with solid foods. She has had a number of upper endoscopies which have revealed anastomotic stricture with a marginal ulcer. The stricture was dilated in July 2017 with improvement of her symptoms. More recently, the patient has an acute onset of severe epigastric pain not related to oral intake. She stated that the pain would resolve spontaneously after 15 minutes. CT scan of the abdomen and pelvis on 09/02/2016 was negative for any acute findings. Her abdominal pain recurred waking her up the patient from sleep approximately 1 week prior to this admission.  Due to her recurrent abdominal pain, the patient was admitted for diagnostic laparoscopy. The patient states that she has had overall decreased oral intake because of her abdominal pain but is able to tolerate solid foods. The patient has had a decline in her weight, but her weight has been stable over the past 2 months. On 11/30/2016, the patient underwent lysis of adhesions and upper endoscopy. The upper endoscopy showed a widely patent gastrojejunostomy without ulceration or stricture.  Postoperatively, the patient has had hypotension with systolic blood pressure in the 80-90s. As  result, internal medicine was consulted for further assistance.   Discharge Diagnoses:  Hypotension-->primary adrenal insufficiency -This is likely multifactorial including decreased oral intake/volume depletion, fluid shifts, opioid medications, and possibly primary adrenal insufficiency -Random cortisol 0.6 -Continue IV fluids--change to NS with KCl and increased rate -Review of the medical records shows that in the past 4 months, the patient's systolic blood pressures normally ranges 100-110 -lactic acid 1.0 -d/c midodrine -TSH 1.434 -Check Cortrosyn stimulation test-->1.6-->12.1-->18.0 -12/03/16--case discussed with endocrine, Dr. Caesar Bookman kindly agree to see patient after discharge -pt likely has a degree of relative adrenal insufficiency as her BP remained low after aggressive fluid resuscitation -started IV solucortef-->SBP improving to low 100s on numerous checks (104/65 before d/c) -discharge home with cortef 20mg  in am, 10 mg in pm until she follows up with Dr. Dwyane Dee  Atypical chest pain -personally reviewed 12/02/16 EKG--NSR, no ST-T changes -CXR--personally reviewed, negative  -finish cycling troponins--neg x 3  S/p gastrojejunostomy -s/p lysis of adhesions -per general surgery   Discharge Instructions  Discharge Instructions    Diet - low sodium heart healthy    Complete by:  As directed    Increase activity slowly    Complete by:  As directed      Allergies as of 12/03/2016      Reactions   Advair Diskus [fluticasone-salmeterol] Swelling   In face and lips.       Medication List    TAKE these medications   acetaminophen 500 MG tablet Commonly known as:  TYLENOL Take 1,000 mg by mouth every 6 (six) hours as needed for moderate pain or headache.   B-12 2500 MCG Tabs Take 2,500 mcg by mouth daily.  FLINSTONES GUMMIES OMEGA-3 DHA Chew Chew 1 each by mouth every morning.   furosemide 40 MG tablet Commonly known as:  LASIX Take 40 mg by mouth  daily as needed for fluid or edema.   hydrocortisone 10 MG tablet Commonly known as:  CORTEF Take 1 tablet (10 mg total) by mouth at bedtime. Start taking on:  12/04/2016   hydrocortisone 20 MG tablet Commonly known as:  CORTEF Take 1 tablet (20 mg total) by mouth daily. Start taking on:  12/04/2016   ondansetron 4 MG disintegrating tablet Commonly known as:  ZOFRAN-ODT Take 1 tablet by mouth every 4 (four) hours as needed for nausea or vomiting.   oxyCODONE 5 MG immediate release tablet Commonly known as:  Oxy IR/ROXICODONE Take 1-2 tablets (5-10 mg total) by mouth every 6 (six) hours as needed for severe pain.   pantoprazole 40 MG tablet Commonly known as:  PROTONIX Take 1 tablet (40 mg total) by mouth 2 (two) times daily.   SLEEP AID PO Take 1 tablet by mouth at bedtime as needed (sleep).      Follow-up Information    Schedule an appointment as soon as possible for a visit in 1 month(s) to follow up.        Excell Seltzer, MD. Schedule an appointment as soon as possible for a visit in 1 month(s).   Specialty:  General Surgery Contact information: [address] [place] [address] [phone]        Elayne Snare, MD Follow up in 1 week(s).   Specialty:  Endocrinology Contact information: [address] [place] [address] [phone]          Allergies  Allergen Reactions  . Advair Diskus [Fluticasone-Salmeterol] Swelling    In face and lips.     Consultations:  General surgery  Endocrine on phone--Dr. Dwyane Dee   Procedures/Studies: Dg Chest 2 View  Result Date: 12/01/2016 CLINICAL DATA:  Status post laparoscopic lysis of adhesions and upper endoscopy EXAM: CHEST  2 VIEW COMPARISON:  09/12/2014 FINDINGS: Pneumoperitoneum noted. The heart size and mediastinal contours are within normal limits. Both lungs are clear. The visualized skeletal structures are unremarkable. IMPRESSION: 1. No acute cardiopulmonary abnormalities.  2. Pneumoperitoneum.  Compatible with recent surgery. Electronically Signed   By: Kerby Moors M.D.   On: 12/01/2016 14:28        Discharge Exam: Vitals:   12/03/16 1044 12/03/16 1207  BP: (!) 101/59 (!) 104/50  Pulse: 63 (!) 54  Resp:    Temp:     Vitals:   12/03/16 0503 12/03/16 1000 12/03/16 1044 12/03/16 1207  BP:  (!) 90/53 (!) 101/59 (!) 104/50  Pulse:  65 63 (!) 54  Resp:  16    Temp: 98.7 F (37.1 C) 97.9 F (36.6 C)    TempSrc: Oral Oral    SpO2:  100%    Weight:      Height:        General: Pt is alert, awake, not in acute distress Cardiovascular: RRR, S1/S2 +, no rubs, no gallops Respiratory: CTA bilaterally, no wheezing, no rhonchi Abdominal: Soft, mild diffuse tenderness, ND, bowel sounds + Extremities: no edema, no cyanosis   The results of significant diagnostics from this hospitalization (including imaging, microbiology, ancillary and laboratory) are listed below for reference.    Significant Diagnostic Studies: Dg Chest 2 View  Result Date: 12/01/2016 CLINICAL DATA:  Status post laparoscopic lysis of adhesions and upper endoscopy EXAM: CHEST  2 VIEW COMPARISON:  09/12/2014 FINDINGS: Pneumoperitoneum noted. The heart size and mediastinal contours are within normal limits. Both lungs are clear. The visualized skeletal structures are unremarkable. IMPRESSION: 1. No acute cardiopulmonary abnormalities. 2. Pneumoperitoneum.  Compatible with recent surgery. Electronically Signed   By: Kerby Moors M.D.   On: 12/01/2016 14:28     Microbiology: No results found for this or any previous visit (from the past 240 hour(s)).   Labs: Basic Metabolic Panel:  Recent Labs Lab 12/02/16 0414 12/03/16 0511  NA 140 139  K 3.9 4.2  CL 102 105  CO2 32 29  GLUCOSE 90 103*  BUN 13 10  CREATININE 0.59 0.49  CALCIUM 8.4* 8.3*  MG  --  1.8   Liver Function Tests:  Recent Labs Lab 12/02/16 0414 12/03/16 0511  AST 89* 270*  ALT 82* 232*  ALKPHOS 68 77    BILITOT 0.5 0.6  PROT 5.4* 5.2*  ALBUMIN 3.0* 2.8*   No results for input(s): LIPASE, AMYLASE in the last 168 hours. No results for input(s): AMMONIA in the last 168 hours. CBC:  Recent Labs Lab 12/01/16 0736 12/02/16 0414 12/03/16 0511  WBC 8.1 6.4 4.0  HGB 11.7* 11.9* 11.3*  HCT 34.9* 35.3* 35.0*  MCV 94.3 95.4 96.4  PLT 182 166 147*   Cardiac Enzymes:  Recent Labs Lab 12/01/16 1212 12/02/16 0840 12/02/16 1401  TROPONINI <0.03 <0.03 <0.03   BNP: Invalid input(s): POCBNP CBG: No results for input(s): GLUCAP in the last 168 hours.  Time coordinating discharge:  Greater than 30 minutes  Signed:  Maranda Marte, DO [facility] Pager: [phone] 12/03/2016, 2:02 PM

## 2016-12-02 NOTE — Progress Notes (Signed)
Notified on call K. Schorr about pt low BP 81/51. Pt is asymptomatic. 500 cc Ns bolus ordered. Will continue to monitor.

## 2016-12-03 DIAGNOSIS — I9589 Other hypotension: Secondary | ICD-10-CM

## 2016-12-03 DIAGNOSIS — E271 Primary adrenocortical insufficiency: Secondary | ICD-10-CM

## 2016-12-03 LAB — HEPATIC FUNCTION PANEL
ALT: 232 U/L — ABNORMAL HIGH (ref 14–54)
AST: 270 U/L — ABNORMAL HIGH (ref 15–41)
Albumin: 2.8 g/dL — ABNORMAL LOW (ref 3.5–5.0)
Alkaline Phosphatase: 77 U/L (ref 38–126)
BILIRUBIN DIRECT: 0.2 mg/dL (ref 0.1–0.5)
BILIRUBIN INDIRECT: 0.4 mg/dL (ref 0.3–0.9)
Total Bilirubin: 0.6 mg/dL (ref 0.3–1.2)
Total Protein: 5.2 g/dL — ABNORMAL LOW (ref 6.5–8.1)

## 2016-12-03 LAB — BASIC METABOLIC PANEL
ANION GAP: 5 (ref 5–15)
BUN: 10 mg/dL (ref 6–20)
CHLORIDE: 105 mmol/L (ref 101–111)
CO2: 29 mmol/L (ref 22–32)
Calcium: 8.3 mg/dL — ABNORMAL LOW (ref 8.9–10.3)
Creatinine, Ser: 0.49 mg/dL (ref 0.44–1.00)
GFR calc non Af Amer: >60 mL/min (ref >60)
Glucose, Bld: 103 mg/dL — ABNORMAL HIGH (ref 65–99)
Potassium: 4.2 mmol/L (ref 3.5–5.1)
Sodium: 139 mmol/L (ref 135–145)

## 2016-12-03 LAB — CBC
HEMATOCRIT: 35 % — AB (ref 36.0–46.0)
HEMOGLOBIN: 11.3 g/dL — AB (ref 12.0–15.0)
MCH: 31.1 pg (ref 26.0–34.0)
MCHC: 32.3 g/dL (ref 30.0–36.0)
MCV: 96.4 fL (ref 78.0–100.0)
PLATELETS: 147 10*3/uL — AB (ref 150–400)
RBC: 3.63 MIL/uL — AB (ref 3.87–5.11)
RDW: 12.7 % (ref 11.5–15.5)
WBC: 4 10*3/uL (ref 4.0–10.5)

## 2016-12-03 LAB — ACTH STIMULATION, 3 TIME POINTS
Cortisol, 30 Min: 12.1 ug/dL
Cortisol, 60 Min: 18 ug/dL
Cortisol, Base: 1.6 ug/dL

## 2016-12-03 LAB — MAGNESIUM: Magnesium: 1.8 mg/dL (ref 1.7–2.4)

## 2016-12-03 MED ORDER — HYDROCORTISONE 20 MG PO TABS: 20.0000 mg | ORAL_TABLET | Freq: Every day | ORAL | 0 refills | Status: DC

## 2016-12-03 MED ORDER — HYDROCORTISONE NA SUCCINATE PF 100 MG IJ SOLR
50.0000 mg | Freq: Once | INTRAMUSCULAR | Status: AC
  Administered 2016-12-03: 11:00:00 50 mg via INTRAVENOUS
  Filled 2016-12-03: qty 1

## 2016-12-03 MED ORDER — HYDROCORTISONE 10 MG PO TABS: 10.0000 mg | ORAL_TABLET | Freq: Every day | ORAL | Status: DC

## 2016-12-03 MED ORDER — HYDROCORTISONE 10 MG PO TABS: 10.0000 mg | ORAL_TABLET | Freq: Every day | ORAL | 0 refills | Status: DC

## 2016-12-03 MED ORDER — HYDROCORTISONE 20 MG PO TABS: 20.0000 mg | ORAL_TABLET | Freq: Every day | ORAL | Status: DC

## 2016-12-03 NOTE — Progress Notes (Signed)
3 Days Post-Op   Subjective: No complaints this morning. Had a brief, approximately 3-4 minute episode of crampy epigastric pain last night that immediately resolved. Tolerating diet and pain-free currently. Anxious to go home. Blood pressure has been somewhat better.  Objective: Vital signs in last 24 hours: Temp:  [97.9 F (36.6 C)-98.7 F (37.1 C)] 97.9 F (36.6 C) (05/11 1000) Pulse Rate:  [62-75] 63 (05/11 1044) Resp:  [14-16] 16 (05/11 1000) BP: (79-101)/(43-59) 101/59 (05/11 1044) SpO2:  [99 %-100 %] 100 % (05/11 1000) Last BM Date: 12/02/16  Intake/Output from previous day: 05/10 0701 - 05/11 0700 In: 3567.5 [P.O.:1080; I.V.:2487.5] Out: 2300 [Urine:2300] Intake/Output this shift: Total I/O In: 320 [P.O.:320] Out: -   General appearance: alert, cooperative and no distress GI: normal findings: soft, non-tender and Nondistended Incision/Wound: Clean and dry  Lab Results:   Recent Labs  12/02/16 0414 12/03/16 0511  WBC 6.4 4.0  HGB 11.9* 11.3*  HCT 35.3* 35.0*  PLT 166 147*   BMET  Recent Labs  12/02/16 0414 12/03/16 0511  NA 140 139  K 3.9 4.2  CL 102 105  CO2 32 29  GLUCOSE 90 103*  BUN 13 10  CREATININE 0.59 0.49  CALCIUM 8.4* 8.3*     Studies/Results: Dg Chest 2 View  Result Date: 12/01/2016 CLINICAL DATA:  Status post laparoscopic lysis of adhesions and upper endoscopy EXAM: CHEST  2 VIEW COMPARISON:  09/12/2014 FINDINGS: Pneumoperitoneum noted. The heart size and mediastinal contours are within normal limits. Both lungs are clear. The visualized skeletal structures are unremarkable. IMPRESSION: 1. No acute cardiopulmonary abnormalities. 2. Pneumoperitoneum.  Compatible with recent surgery. Electronically Signed   By: Kerby Moors M.D.   On: 12/01/2016 14:28    Anti-infectives: Anti-infectives    Start     Dose/Rate Route Frequency Ordered Stop   11/30/16 0654  ceFAZolin (ANCEF) 2-4 GM/100ML-% IVPB    Comments:  Danley Danker   :  cabinet override      11/30/16 0654 11/30/16 0734   11/30/16 0551  ceFAZolin (ANCEF) IVPB 2g/100 mL premix     2 g 200 mL/hr over 30 Minutes Intravenous On call to O.R. 11/30/16 [id number] 11/30/16 0744      Assessment/Plan: s/p Procedure(s): LAPAROSCOPIC LYSIS OF ADHESIONS UPPER ENDOSCOPY Doing well postoperatively without issues today and okay for discharge from surgical perspective. Appreciate internal medicine helped. ACTH stimulation possibly consistent with adrenal insufficiency and apparent plans for discharge on steroids and follow-up with endocrinology. I will see her back in 1 month.   LOS: 1 day    Karrigan Messamore T 5/11/2018Patient ID: Kristen Delacruz, female   DOB: 11-10-1965, 51 y.o.   MRN: [id number]

## 2016-12-06 LAB — ACTH: C206 ACTH: 5 pg/mL — AB (ref 7.2–63.3)

## 2016-12-27 NOTE — Addendum Note (Signed)
Addendum  created 12/27/16 1423 by Myrtie Soman, MD   Sign clinical note

## 2016-12-27 NOTE — Anesthesia Postprocedure Evaluation (Signed)
Anesthesia Post Note  Patient: Kristen Delacruz  Procedure(s) Performed: Procedure(s) (LRB): ESOPHAGOGASTRODUODENOSCOPY (EGD) WITH PROPOFOL POSSIBLE DILATATION (N/A)     Anesthesia Post Evaluation  Last Vitals:  Vitals:   09/20/16 0840 09/20/16 0900  BP: (!) 80/43 103/69  Pulse: (!) 55   Resp: 10   Temp:      Last Pain:  Vitals:   09/20/16 0815  TempSrc: Oral                 Breonia Kirstein S

## 2016-12-27 NOTE — Addendum Note (Signed)
Addendum  created 12/27/16 1132 by Myrtie Soman, MD   Sign clinical note

## 2017-11-02 ENCOUNTER — Ambulatory Visit: Payer: Self-pay | Admitting: Surgery

## 2017-11-29 ENCOUNTER — Encounter (HOSPITAL_COMMUNITY): Payer: Self-pay

## 2017-11-29 ENCOUNTER — Other Ambulatory Visit: Payer: Self-pay

## 2017-12-05 ENCOUNTER — Encounter (HOSPITAL_COMMUNITY): Payer: Self-pay

## 2017-12-05 ENCOUNTER — Ambulatory Visit (HOSPITAL_COMMUNITY): Payer: BLUE CROSS/BLUE SHIELD | Admitting: Anesthesiology

## 2017-12-05 ENCOUNTER — Encounter (HOSPITAL_COMMUNITY)
Admission: RE | Disposition: A | Discharge status: Home / Self Care | Payer: Self-pay | Source: Ambulatory Visit | Attending: Surgery

## 2017-12-05 ENCOUNTER — Ambulatory Visit (HOSPITAL_COMMUNITY)
Admission: RE | Admit: 2017-12-05 | Discharge: 2017-12-05 | Disposition: A | Discharge status: Home / Self Care | Payer: BLUE CROSS/BLUE SHIELD | Source: Ambulatory Visit | Attending: Surgery | Admitting: Surgery

## 2017-12-05 DIAGNOSIS — Q438 Other specified congenital malformations of intestine: Secondary | ICD-10-CM | POA: Diagnosis not present

## 2017-12-05 DIAGNOSIS — G4730 Sleep apnea, unspecified: Secondary | ICD-10-CM | POA: Insufficient documentation

## 2017-12-05 DIAGNOSIS — Z85828 Personal history of other malignant neoplasm of skin: Secondary | ICD-10-CM | POA: Insufficient documentation

## 2017-12-05 DIAGNOSIS — Z1211 Encounter for screening for malignant neoplasm of colon: Secondary | ICD-10-CM | POA: Insufficient documentation

## 2017-12-05 DIAGNOSIS — Z9884 Bariatric surgery status: Secondary | ICD-10-CM | POA: Diagnosis not present

## 2017-12-05 DIAGNOSIS — K219 Gastro-esophageal reflux disease without esophagitis: Secondary | ICD-10-CM | POA: Diagnosis not present

## 2017-12-05 HISTORY — DX: Headache: R51

## 2017-12-05 HISTORY — PX: COLONOSCOPY WITH PROPOFOL: SHX5780

## 2017-12-05 HISTORY — DX: Headache, unspecified: R51.9

## 2017-12-05 SURGERY — COLONOSCOPY WITH PROPOFOL
Anesthesia: Monitor Anesthesia Care

## 2017-12-05 MED ORDER — PROPOFOL 10 MG/ML IV BOLUS
INTRAVENOUS | Status: AC
  Filled 2017-12-05: qty 40

## 2017-12-05 MED ORDER — PROPOFOL 500 MG/50ML IV EMUL
INTRAVENOUS | Status: DC | PRN
  Administered 2017-12-05: 150 ug/kg/min via INTRAVENOUS

## 2017-12-05 MED ORDER — PROPOFOL 10 MG/ML IV BOLUS
INTRAVENOUS | Status: DC | PRN
  Administered 2017-12-05 (×10): 20 mg via INTRAVENOUS

## 2017-12-05 MED ORDER — PROPOFOL 10 MG/ML IV BOLUS
INTRAVENOUS | Status: AC
  Filled 2017-12-05: qty 20

## 2017-12-05 MED ORDER — LACTATED RINGERS IV SOLN
INTRAVENOUS | Status: DC
  Administered 2017-12-05: 1000 mL via INTRAVENOUS

## 2017-12-05 MED ORDER — SODIUM CHLORIDE 0.9 % IV SOLN: INTRAVENOUS | Status: DC

## 2017-12-05 MED ORDER — ONDANSETRON HCL 4 MG/2ML IJ SOLN
INTRAMUSCULAR | Status: DC | PRN
  Administered 2017-12-05: 4 mg via INTRAVENOUS

## 2017-12-05 NOTE — Discharge Instructions (Signed)

## 2017-12-05 NOTE — Op Note (Addendum)
[facility] Patient Name: Kristen Delacruz Procedure Date: 12/05/2017 MRN: [id number] Attending MD: Ileana Roup MD, MD Date of Birth: 1965-09-14 CSN: [id number] Age: 52 Admit Type: Outpatient Procedure:                Colonoscopy Indications:              Screening for colorectal malignant neoplasm Providers:                Sharon Mt. Maybel Dambrosio MD, MD, Angus Seller, Cletis Athens, Technician Referring MD:              Medicines:                Monitored Anesthesia Care Complications:            No immediate complications. Estimated Blood Loss:     Estimated blood loss: none. Procedure:                After obtaining informed consent, the colonoscope                            was passed under direct vision. Throughout the                            procedure, the patient's blood pressure, pulse, and                            oxygen saturations were monitored continuously. The                            was introduced through the anus and advanced to the                            the terminal ileum, with identification of the                            appendiceal orifice and IC valve. The colonoscopy                            was technically difficult and complex due to a                            redundant colon, significant looping and a tortuous                            colon. Successful completion of the procedure was                            aided by applying abdominal pressure. The patient                            tolerated the procedure well. The quality of the  bowel preparation was adequate. Scope In: 9:25:09 AM Scope Out: 10:25:14 AM Scope Withdrawal Time: 0 hours 20 minutes 8 seconds  Total Procedure Duration: 1 hour 0 minutes 5 seconds  Findings:      The perianal and digital rectal examinations were normal.      The entire examined colon appeared normal on direct and retroflexion   views. Impression:               - The entire examined colon is normal on direct and                            retroflexion views.                           - No specimens collected. Moderate Sedation:      N/A- Per Anesthesia Care Recommendation:           - Discharge patient to home.                           - Resume previous diet indefinitely.                           - Continue present medications.                           - Repeat colonoscopy in 10 years for screening                            purposes.                           - Return to primary care physician at appointment                            to be scheduled. Procedure Code(s):        --- Professional ---                           I9518, Colorectal cancer screening; colonoscopy on                            individual not meeting criteria for high risk Diagnosis Code(s):        --- Professional ---                           Z12.11, Encounter for screening for malignant                            neoplasm of colon CPT copyright 2017 American Medical Association. All rights reserved. The codes documented in this report are preliminary and upon coder review may  be revised to meet current compliance requirements. Nadeen Landau, MD Ileana Roup MD, MD 12/05/2017 10:33:09 AM This report has been signed electronically. Number of Addenda: 0

## 2017-12-05 NOTE — Anesthesia Preprocedure Evaluation (Signed)
Anesthesia Evaluation  Patient identified by MRN, date of birth, ID band Patient awake    Reviewed: Allergy & Precautions, NPO status , Patient's Chart, lab work & pertinent test results  History of Anesthesia Complications (+) PONV and history of anesthetic complications  Airway Mallampati: II  TM Distance: >3 FB Neck ROM: Full    Dental  (+) Teeth Intact   Pulmonary neg pulmonary ROS,    breath sounds clear to auscultation       Cardiovascular negative cardio ROS   Rhythm:Regular     Neuro/Psych  Headaches, negative psych ROS   GI/Hepatic Neg liver ROS, hiatal hernia, GERD  Medicated and Controlled,  Endo/Other  negative endocrine ROS  Renal/GU negative Renal ROS     Musculoskeletal negative musculoskeletal ROS (+)   Abdominal   Peds  Hematology negative hematology ROS (+)   Anesthesia Other Findings   Reproductive/Obstetrics                             Anesthesia Physical Anesthesia Plan  ASA: II  Anesthesia Plan: MAC   Post-op Pain Management:    Induction: Intravenous  PONV Risk Score and Plan: 3 and Treatment may vary due to age or medical condition  Airway Management Planned: Nasal Cannula  Additional Equipment: None  Intra-op Plan:   Post-operative Plan:   Informed Consent: I have reviewed the patients History and Physical, chart, labs and discussed the procedure including the risks, benefits and alternatives for the proposed anesthesia with the patient or authorized representative who has indicated his/her understanding and acceptance.   Dental advisory given  Plan Discussed with: CRNA and Surgeon  Anesthesia Plan Comments:         Anesthesia Quick Evaluation

## 2017-12-05 NOTE — Anesthesia Procedure Notes (Signed)
Procedure Name: MAC Date/Time: 12/05/2017 9:23 AM Performed by: Lollie Sails, CRNA Pre-anesthesia Checklist: Patient identified, Emergency Drugs available, Suction available and Patient being monitored Oxygen Delivery Method: Simple face mask
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
## 2017-12-05 NOTE — Transfer of Care (Signed)
Immediate Anesthesia Transfer of Care Note  Patient: Kristen Delacruz  Procedure(s) Performed: COLONOSCOPY WITH PROPOFOL (N/A )  Patient Location: PACU and Endoscopy Unit  Anesthesia Type:MAC  Level of Consciousness: awake, drowsy and responds to stimulation  Airway & Oxygen Therapy: Patient Spontanous Breathing and Patient connected to face mask oxygen  Post-op Assessment: Report given to RN and Post -op Vital signs reviewed and stable  Post vital signs: Reviewed and stable  Last Vitals:  Vitals Value Taken Time  BP 101/63 12/05/2017 10:30 AM  Temp    Pulse 68 12/05/2017 10:31 AM  Resp 11 12/05/2017 10:31 AM  SpO2 100 % 12/05/2017 10:31 AM  Vitals shown include unvalidated device data.  Last Pain:  Vitals:   12/05/17 0758  TempSrc: Oral  PainSc: 0-No pain         Complications: No apparent anesthesia complications

## 2017-12-05 NOTE — Anesthesia Postprocedure Evaluation (Signed)
Anesthesia Post Note  Patient: Kristen Delacruz  Procedure(s) Performed: COLONOSCOPY WITH PROPOFOL (N/A )     Patient location during evaluation: Endoscopy Anesthesia Type: MAC Level of consciousness: awake and alert Pain management: pain level controlled Vital Signs Assessment: post-procedure vital signs reviewed and stable Respiratory status: spontaneous breathing, nonlabored ventilation, respiratory function stable and patient connected to nasal cannula oxygen Cardiovascular status: stable and blood pressure returned to baseline Postop Assessment: no apparent nausea or vomiting Anesthetic complications: no    Last Vitals:  Vitals:   12/05/17 1045 12/05/17 1050  BP:  103/64  Pulse: (!) 53 60  Resp: 12 14  Temp:    SpO2: 100% 98%    Last Pain:  Vitals:   12/05/17 1030  TempSrc: Oral  PainSc: 0-No pain                 Melody Cirrincione

## 2018-02-09 DIAGNOSIS — N952 Postmenopausal atrophic vaginitis: Secondary | ICD-10-CM | POA: Insufficient documentation

## 2018-07-28 ENCOUNTER — Ambulatory Visit: Payer: Self-pay | Admitting: Surgery

## 2018-08-23 ENCOUNTER — Encounter (HOSPITAL_COMMUNITY): Payer: Self-pay | Admitting: *Deleted

## 2018-08-23 ENCOUNTER — Other Ambulatory Visit: Payer: Self-pay

## 2018-08-23 NOTE — H&P (Signed)
Kristen Delacruz  Location: [facility] Surgery Patient #: [id number] DOB: 1965-09-06 Married / Language: English / Race: White Female  History of Present Illness   The patient is a 53 year old female presenting status-post bariatric surgery. 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 She had no postoperative complications and on her first visit at 3 weeks she was doing very well. Following this she developed some progressive dysphagia and occasional regurgitation and epigastric discomfort with solid foods. We obtained a barium swallow which showed some narrowing at her gastrojejunostomy with free flow of liquid but the barium tablet did not pass. We then backed off to a mostly liquid diet. Her symptoms continued and she underwent upper endoscopy 5 months postoperatively by Dr. Lucia Gaskins. He found a small marginal ulcer and a 12 mm anastomosis that he did not feel was tight enough to warrant dilatation at this time.  She continued to have symptoms of postprandial pain and nausea and underwent a repeat endoscopy on July 31, 2015. This showed a very questionable small marginal ulcer, and anastomosis estimated to be 13 mm in overall impression that it looked improved from her original endoscopy. Dilatation was not performed. Her symptoms continued however and she underwent repeat endoscopy in July 2017 with dilatation. She had improvement after that.    In 2018 she began to have episodic severe brief episodes of crampy abdominal pain that resolved spontaneously. Workup was negative and we elected to proceed with laparoscopy to rule out internal hernia and also plan upper endoscopy to take another look at her anastomosis. She returns for her first postoperative visit following this procedure. 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 Upper endoscopy actually showed a widely patent 2 cm  gastrojejunostomy without ulceration or inflammation.   She comes in today for routine follow-up. She has generally been doing well. Constipation is her main complaint which leads to gas cramps. She has not been using anything except apple juice. Over the last year she has tolerated solid food better and her weight is actually up 6 pounds. She does however say that in the last several months she has again been experiencing increasing difficulty with solid food with some epigastric pressure and slimy on occasion. She saw her primary recently and lab work was obtained. We will get these results. She is taking her supplements regular. She has been a little more fatigued in recent months.   So she's had an upper endo - 03/25/2015, 07/31/2015, 02/02/2016 (dilitation to 15 cm), 09/20/2016 (dilitation to 15 cm), and 11/30/2016.  Allergies (Tanisha A. Owens Shark, [place]; 07/27/2018 4:20 PM) Advair Diskus *ANTIASTHMATIC AND BRONCHODILATOR AGENTS*  Rash, Swelling. Fluticasone Propionate *NASAL AGENTS - SYSTEMIC AND TOPICAL*  Allergies Reconciled   Medication History (Tanisha A. Owens Shark, [place]; 07/27/2018 4:21 PM) Multi-Vitamin (Oral) Active. Vitamin B Complex (Oral) Active. Ondansetron (4MG  Tablet Disint, 1 (one) Oral every six hours, as needed for nausea, Taken starting 10/17/2017) Active. Medications Reconciled  Vitals (Tanisha A. Brown RMA; 07/27/2018 4:19 PM) 07/27/2018 4:18 PM Weight: 131.4 lb Height: 65in Body Surface Area: 1.65 m Body Mass Index: 21.87 kg/m  Temp.: 98.59F  Pulse: 92 (Regular)  BP: 118/84 (Sitting, Left Arm, Standard)   Physical Exam  General: Thin but healthy-appearing Caucasian female no distress Skin: No rash or infection Lungs: Clear easy respirations Cardiac: Regular rate and rhythm. No edema Abdomen: Soft and nontender. Incisions well-healed.  No hernias.   Assessment & Plan  1.  GASTRIC BYPASS STATUS FOR OBESITY (Z98.84)  Impression: 3 1/2 years Status post  gastric bypass with history of of anastomotic stricture and small marginal ulcer on previous endoscopy. History of recurrent abdominal pain, status post repair of Peterson defect and essentially normal upper endoscopy in May 2018. She generally has done better since that time and has gained a few pounds to current BMI of 21.8 at 131 pounds. However she is describing some increasing dysphagia over the last few months.   I want to avoid getting into the further weight loss and I will ask Dr. Lucia Gaskins to consider repeat endoscopy with possible dilatation. In terms for constipation she is not using any medication and we will start Benefiber and MiraLAX. She will let me know if this is not effective and we could consider Linzess. I will await the results of her endoscopy and we will get her routine appointment in 1 year.   We will obtain results of her recent lab work to make sure we have checked B vitamins and iron levels.  2.  Constipation   Alphonsa Overall, MD, [facility] Surgery Pager: [phone] Office phone:  [phone]

## 2018-08-23 NOTE — Anesthesia Preprocedure Evaluation (Addendum)
Anesthesia Evaluation  Patient identified by MRN, date of birth, ID band Patient awake    Reviewed: Allergy & Precautions, NPO status , Patient's Chart, lab work & pertinent test results, Unable to perform ROS - Chart review only  History of Anesthesia Complications (+) PONV  Airway Mallampati: II  TM Distance: >3 FB Neck ROM: Full    Dental no notable dental hx. (+) Teeth Intact, Dental Advisory Given   Pulmonary asthma , pneumonia,    Pulmonary exam normal breath sounds clear to auscultation       Cardiovascular Normal cardiovascular exam Rhythm:Regular Rate:Normal     Neuro/Psych  Headaches,    GI/Hepatic Neg liver ROS, GERD  ,  Endo/Other    Renal/GU negative Renal ROS     Musculoskeletal negative musculoskeletal ROS (+)   Abdominal   Peds  Hematology   Anesthesia Other Findings   Reproductive/Obstetrics                           Anesthesia Physical Anesthesia Plan  ASA: II  Anesthesia Plan: MAC   Post-op Pain Management:    Induction: Intravenous  PONV Risk Score and Plan:   Airway Management Planned: Nasal Cannula and Natural Airway  Additional Equipment:   Intra-op Plan:   Post-operative Plan:   Informed Consent: I have reviewed the patients History and Physical, chart, labs and discussed the procedure including the risks, benefits and alternatives for the proposed anesthesia with the patient or authorized representative who has indicated his/her understanding and acceptance.     Dental advisory given  Plan Discussed with:   Anesthesia Plan Comments: (EGD)        Anesthesia Quick Evaluation

## 2018-08-23 NOTE — Progress Notes (Signed)
Spoke with patient via telephone for pre procedure interview. No medications AM of procedure. Driver will be husband Aaron Edelman. NPO after MN. Arrival time 1130.

## 2018-08-24 ENCOUNTER — Other Ambulatory Visit: Payer: Self-pay

## 2018-08-24 ENCOUNTER — Encounter (HOSPITAL_COMMUNITY)
Admission: RE | Disposition: A | Discharge status: Home / Self Care | Payer: Self-pay | Source: Home / Self Care | Attending: Surgery

## 2018-08-24 ENCOUNTER — Encounter (HOSPITAL_COMMUNITY): Payer: Self-pay | Admitting: *Deleted

## 2018-08-24 ENCOUNTER — Ambulatory Visit (HOSPITAL_COMMUNITY)
Admission: RE | Admit: 2018-08-24 | Discharge: 2018-08-24 | Disposition: A | Discharge status: Home / Self Care | Payer: BLUE CROSS/BLUE SHIELD | Attending: Surgery | Admitting: Surgery

## 2018-08-24 ENCOUNTER — Ambulatory Visit (HOSPITAL_COMMUNITY): Payer: BLUE CROSS/BLUE SHIELD | Admitting: Anesthesiology

## 2018-08-24 DIAGNOSIS — Z9884 Bariatric surgery status: Secondary | ICD-10-CM | POA: Insufficient documentation

## 2018-08-24 DIAGNOSIS — Z888 Allergy status to other drugs, medicaments and biological substances status: Secondary | ICD-10-CM | POA: Insufficient documentation

## 2018-08-24 DIAGNOSIS — R110 Nausea: Secondary | ICD-10-CM | POA: Insufficient documentation

## 2018-08-24 DIAGNOSIS — K5900 Constipation, unspecified: Secondary | ICD-10-CM | POA: Insufficient documentation

## 2018-08-24 DIAGNOSIS — Z79899 Other long term (current) drug therapy: Secondary | ICD-10-CM | POA: Insufficient documentation

## 2018-08-24 HISTORY — PX: BIOPSY: SHX5522

## 2018-08-24 HISTORY — PX: ESOPHAGOGASTRODUODENOSCOPY (EGD) WITH PROPOFOL: SHX5813

## 2018-08-24 SURGERY — ESOPHAGOGASTRODUODENOSCOPY (EGD) WITH PROPOFOL
Anesthesia: Monitor Anesthesia Care

## 2018-08-24 MED ORDER — SODIUM CHLORIDE 0.9 % IV SOLN: INTRAVENOUS | Status: DC

## 2018-08-24 MED ORDER — PROPOFOL 10 MG/ML IV BOLUS
INTRAVENOUS | Status: AC
Start: 2018-08-24 — End: ?
  Filled 2018-08-24: qty 20

## 2018-08-24 MED ORDER — PROPOFOL 10 MG/ML IV BOLUS
INTRAVENOUS | Status: DC | PRN
  Administered 2018-08-24: 30 mg via INTRAVENOUS
  Administered 2018-08-24 (×3): 20 mg via INTRAVENOUS
  Administered 2018-08-24: 40 mg via INTRAVENOUS

## 2018-08-24 MED ORDER — PROPOFOL 500 MG/50ML IV EMUL
INTRAVENOUS | Status: DC | PRN
  Administered 2018-08-24: 135 ug/kg/min via INTRAVENOUS

## 2018-08-24 MED ORDER — PROPOFOL 10 MG/ML IV BOLUS
INTRAVENOUS | Status: AC
  Filled 2018-08-24: qty 20

## 2018-08-24 MED ORDER — LACTATED RINGERS IV SOLN
INTRAVENOUS | Status: DC
Start: 2018-08-24 — End: 2018-08-24
  Administered 2018-08-24: 1000 mL via INTRAVENOUS

## 2018-08-24 SURGICAL SUPPLY — 15 items

## 2018-08-24 NOTE — Interval H&P Note (Signed)
History and Physical Interval Note:  08/24/2018 1:21 PM  Kristen Delacruz  has presented today for surgery, with the diagnosis of history of gastric bypass and dysphagia  The various methods of treatment have been discussed with the patient and family. Gaspar Bidding, her husband, at the bedside.   After consideration of risks, benefits and other options for treatment, the patient has consented to  Procedure(s): ESOPHAGOGASTRODUODENOSCOPY (EGD) WITH PROPOFOL, POSSIBLE DILATION (N/A) as a surgical intervention .  The patient's history has been reviewed, patient examined, no change in status, stable for surgery.  I have reviewed the patient's chart and labs.  Questions were answered to the patient's satisfaction.     Shann Medal

## 2018-08-24 NOTE — Op Note (Signed)
08/24/2018  1:40 PM  PATIENT:  Kristen Delacruz, 53 y.o., female, MRN: [id number]  PREOP DIAGNOSIS:  History of RYGB, nausea with food  POSTOP DIAGNOSIS:   RYGB normal, no ulcer, anastomosis at least 2.0 cm in diameter.  PROCEDURE:  Esophagogastrojejunoscopy  SURGEON:   Alphonsa Overall, M.D.  ANESTHESIA:   Propofol anesthesia provided by anesthesia  Anesthesiologist: Barnet Glasgow, MD CRNA: Niel Hummer, CRNA  INDICATIONS FOR PROCEDURE:  Kristen Delacruz is a 53 y.o. (DOB: 29-May-1966)  white female whose primary care physician is Manfred Shirts, [place] and comes for upper endoscopy to evaluate symptoms of a "football" in her stomach when she eats and heavy saliva right after eating..  The patient had a RYGB on 4/2/2017by Dr. Excell Seltzer.   So she's had an upper endo - 03/25/2015, 07/31/2015, 02/02/2016 (dilitation to 15 cm), 09/20/2016 (dilitation to 15 cm), and 11/30/2016 (Intraop).   The indications and risks of the endoscopy were explained to the patient.  The risks include, but are not limited to, perforation, bleeding, or injury to the bowel.  If balloon dilatation is needed, the risk of perforation is higher.  PROCEDURE:  The patient was in room 4 at [facility] endoscopy unit.  The patient was monitored with a pulse oximetry, BP cuff, and EKG.  The patient has nasal O2 flowing during the procedure.  The patient was given propofol supervised by anesthesia.   A time was held prior to the procedure.   A flexible Olympus endoscope was passed down the throat without difficulty.   Findings include:   Esophagus:   Normal   GE junction at:  35-36 cm.  Normal   Stomach pouch: Normal.   Gastrojejunal anastomosis:   40 cm.  She had no evidence of an ulcer.  There was no stricture or narrowing at the anastomosis.  The anastomosis is wide open at least 2.0 cm.   Efferent jejunal limb:  20 cm, no obstruction.  Afferent jejunal limb:  About 20.  No ulcer or abnormality.   CLO test:  Yes  PLAN:    Photos taken and given to patient.    She is to follow up with Dr. Excell Seltzer in 4 to 6 weeks.  Alphonsa Overall, MD, [facility] Surgery Pager: [phone] Office phone:  [phone]

## 2018-08-24 NOTE — Discharge Instructions (Signed)
Upper Endoscopy, Adult, Care After This sheet gives you information about how to care for yourself after your procedure. Your health care provider may also give you more specific instructions. If you have problems or questions, contact your health care provider. What can I expect after the procedure? After the procedure, it is common to have:  A sore throat.  Mild stomach pain or discomfort.  Bloating.  Nausea. Follow these instructions at home:   Follow instructions from your health care provider about what to eat or drink after your procedure.  Return to your normal activities as told by your health care provider. Ask your health care provider what activities are safe for you.  Take over-the-counter and prescription medicines only as told by your health care provider.  Do not drive for 24 hours if you were given a sedative during your procedure.  Keep all follow-up visits as told by your health care provider. This is important. Contact a health care provider if you have:  A sore throat that lasts longer than one day.  Trouble swallowing. Get help right away if:  You vomit blood or your vomit looks like coffee grounds.  You have: ? A fever. ? Bloody, black, or tarry stools. ? A severe sore throat or you cannot swallow. ? Difficulty breathing. ? Severe pain in your chest or abdomen. Summary  After the procedure, it is common to have a sore throat, mild stomach discomfort, bloating, and nausea.  Do not drive for 24 hours if you were given a sedative during the procedure.  Follow instructions from your health care provider about what to eat or drink after your procedure.  Return to your normal activities as told by your health care provider. This information is not intended to replace advice given to you by your health care provider. Make sure you discuss any questions you have with your health care provider. Document Released: 01/11/2012 Document Revised: 12/12/2017  Document Reviewed: 12/12/2017 Elsevier Interactive Patient Education  2019 [place] TO PATIENT  Activity:  Driving - May drive tomorrow.  Diet:  As tolerated  Follow up appointment:  Call Dr. Matilde Haymaker office [facility] Surgery) at [phone] for an appointment in 4 to 6 weeks.  Medications and dosages:  Resume your home medications.  Call Dr. Excell Seltzer or his office  [phone]) if you have:  Temperature greater than 100.4,  Persistent nausea and vomiting,  Any other questions or concerns you may have after discharge.  In an emergency, call 911 or go to an Emergency Department at a nearby hospital.

## 2018-08-24 NOTE — Anesthesia Procedure Notes (Signed)
Procedure Name: MAC Date/Time: 08/24/2018 1:24 PM Performed by: Niel Hummer, CRNA Pre-anesthesia Checklist: Patient identified, Emergency Drugs available, Suction available and Patient being monitored Patient Re-evaluated:Patient Re-evaluated prior to induction Oxygen Delivery Method: Nasal cannula

## 2018-08-24 NOTE — Transfer of Care (Signed)
Immediate Anesthesia Transfer of Care Note  Patient: Kristen Delacruz  Procedure(s) Performed: ESOPHAGOGASTRODUODENOSCOPY (EGD) WITH PROPOFOL, POSSIBLE DILATION (N/A ) BIOPSY  Patient Location: PACU  Anesthesia Type:MAC  Level of Consciousness: awake, alert  and oriented  Airway & Oxygen Therapy: Patient Spontanous Breathing and Patient connected to nasal cannula oxygen  Post-op Assessment: Report given to RN and Post -op Vital signs reviewed and stable  Post vital signs: Reviewed and stable  Last Vitals:  Vitals Value Taken Time  BP    Temp    Pulse    Resp    SpO2      Last Pain:  Vitals:   08/24/18 1142  TempSrc: Oral  PainSc: 0-No pain         Complications: No apparent anesthesia complications

## 2018-08-24 NOTE — Anesthesia Postprocedure Evaluation (Signed)
Anesthesia Post Note  Patient: Kristen Delacruz  Procedure(s) Performed: ESOPHAGOGASTRODUODENOSCOPY (EGD) WITH PROPOFOL, POSSIBLE DILATION (N/A ) BIOPSY     Patient location during evaluation: Endoscopy Anesthesia Type: MAC Level of consciousness: awake and alert Pain management: pain level controlled Vital Signs Assessment: post-procedure vital signs reviewed and stable Respiratory status: spontaneous breathing, nonlabored ventilation, respiratory function stable and patient connected to nasal cannula oxygen Cardiovascular status: blood pressure returned to baseline and stable Postop Assessment: no apparent nausea or vomiting Anesthetic complications: no    Last Vitals:  Vitals:   08/24/18 1142 08/24/18 1341  BP: 110/61 95/63  Pulse: (!) 58 76  Resp: 11 14  Temp: 36.6 C 36.7 C  SpO2: 100% 100%    Last Pain:  Vitals:   08/24/18 1341  TempSrc: Oral  PainSc: 0-No pain                 Barnet Glasgow

## 2018-08-25 ENCOUNTER — Encounter (HOSPITAL_COMMUNITY): Payer: Self-pay | Admitting: Surgery

## 2018-08-25 LAB — CLOTEST (H. PYLORI), BIOPSY: HELICOBACTER SCREEN: NEGATIVE

## 2019-07-03 ENCOUNTER — Institutional Professional Consult (permissible substitution): Payer: Self-pay | Admitting: Plastic Surgery

## 2019-07-13 ENCOUNTER — Telehealth: Payer: Self-pay | Admitting: Surgery

## 2019-07-13 DIAGNOSIS — Z9884 Bariatric surgery status: Secondary | ICD-10-CM | POA: Insufficient documentation

## 2019-07-13 DIAGNOSIS — K289 Gastrojejunal ulcer, unspecified as acute or chronic, without hemorrhage or perforation: Secondary | ICD-10-CM
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
